# Patient Record
Sex: Female | Born: 1950 | Race: White | Hispanic: No | Marital: Married | Smoking: Current every day smoker
Health system: Southern US, Community
[De-identification: ages and names within clinical notes are randomized; demographics above are authoritative.]

## PROBLEM LIST (undated history)

## (undated) DIAGNOSIS — C801 Malignant (primary) neoplasm, unspecified: Secondary | ICD-10-CM

## (undated) DIAGNOSIS — G43909 Migraine, unspecified, not intractable, without status migrainosus: Secondary | ICD-10-CM

## (undated) DIAGNOSIS — K219 Gastro-esophageal reflux disease without esophagitis: Secondary | ICD-10-CM

## (undated) DIAGNOSIS — I639 Cerebral infarction, unspecified: Secondary | ICD-10-CM

## (undated) DIAGNOSIS — J449 Chronic obstructive pulmonary disease, unspecified: Secondary | ICD-10-CM

## (undated) DIAGNOSIS — I10 Essential (primary) hypertension: Secondary | ICD-10-CM

## (undated) HISTORY — PX: BRAIN SURGERY: SHX531

## (undated) HISTORY — PX: ABDOMINAL HYSTERECTOMY: SHX81

---

## 2003-12-30 ENCOUNTER — Other Ambulatory Visit: Payer: Self-pay

## 2008-01-25 ENCOUNTER — Other Ambulatory Visit: Payer: Self-pay

## 2008-01-25 ENCOUNTER — Emergency Department: Payer: Self-pay | Admitting: Emergency Medicine

## 2011-08-14 ENCOUNTER — Emergency Department: Payer: Self-pay | Admitting: *Deleted

## 2011-10-11 ENCOUNTER — Emergency Department: Payer: Self-pay | Admitting: Emergency Medicine

## 2012-09-24 ENCOUNTER — Ambulatory Visit: Payer: Self-pay

## 2012-09-24 LAB — RAPID STREP-A WITH REFLX: Micro Text Report: NEGATIVE

## 2012-09-26 LAB — BETA STREP CULTURE(ARMC)

## 2015-11-29 ENCOUNTER — Ambulatory Visit
Admission: EM | Admit: 2015-11-29 | Discharge: 2015-11-29 | Disposition: A | Discharge status: Home / Self Care | Payer: Medicare PPO | Attending: Family Medicine | Admitting: Family Medicine

## 2015-11-29 ENCOUNTER — Ambulatory Visit (INDEPENDENT_AMBULATORY_CARE_PROVIDER_SITE_OTHER): Payer: Medicare PPO

## 2015-11-29 ENCOUNTER — Other Ambulatory Visit: Payer: Self-pay

## 2015-11-29 ENCOUNTER — Encounter: Payer: Self-pay | Admitting: *Deleted

## 2015-11-29 DIAGNOSIS — F418 Other specified anxiety disorders: Secondary | ICD-10-CM

## 2015-11-29 DIAGNOSIS — F17200 Nicotine dependence, unspecified, uncomplicated: Secondary | ICD-10-CM | POA: Diagnosis not present

## 2015-11-29 DIAGNOSIS — R062 Wheezing: Secondary | ICD-10-CM

## 2015-11-29 DIAGNOSIS — J449 Chronic obstructive pulmonary disease, unspecified: Secondary | ICD-10-CM | POA: Diagnosis not present

## 2015-11-29 DIAGNOSIS — N390 Urinary tract infection, site not specified: Secondary | ICD-10-CM | POA: Insufficient documentation

## 2015-11-29 DIAGNOSIS — K219 Gastro-esophageal reflux disease without esophagitis: Secondary | ICD-10-CM | POA: Diagnosis not present

## 2015-11-29 DIAGNOSIS — Z79899 Other long term (current) drug therapy: Secondary | ICD-10-CM | POA: Insufficient documentation

## 2015-11-29 DIAGNOSIS — F419 Anxiety disorder, unspecified: Secondary | ICD-10-CM | POA: Insufficient documentation

## 2015-11-29 DIAGNOSIS — I10 Essential (primary) hypertension: Secondary | ICD-10-CM | POA: Insufficient documentation

## 2015-11-29 DIAGNOSIS — F329 Major depressive disorder, single episode, unspecified: Secondary | ICD-10-CM | POA: Insufficient documentation

## 2015-11-29 DIAGNOSIS — M549 Dorsalgia, unspecified: Secondary | ICD-10-CM | POA: Diagnosis present

## 2015-11-29 HISTORY — DX: Gastro-esophageal reflux disease without esophagitis: K21.9

## 2015-11-29 HISTORY — DX: Essential (primary) hypertension: I10

## 2015-11-29 HISTORY — DX: Chronic obstructive pulmonary disease, unspecified: J44.9

## 2015-11-29 HISTORY — DX: Migraine, unspecified, not intractable, without status migrainosus: G43.909

## 2015-11-29 LAB — CBC WITH DIFFERENTIAL/PLATELET
Basophils Absolute: 0 10*3/uL (ref 0–0.1)
Basophils Relative: 0 %
Eosinophils Absolute: 0.2 10*3/uL (ref 0–0.7)
Eosinophils Relative: 3 %
HCT: 43.6 % (ref 35.0–47.0)
Hemoglobin: 14.7 g/dL (ref 12.0–16.0)
Lymphocytes Relative: 37 %
Lymphs Abs: 2.8 10*3/uL (ref 1.0–3.6)
MCH: 31.8 pg (ref 26.0–34.0)
MCHC: 33.7 g/dL (ref 32.0–36.0)
MCV: 94.3 fL (ref 80.0–100.0)
Monocytes Absolute: 0.6 10*3/uL (ref 0.2–0.9)
Monocytes Relative: 8 %
Neutro Abs: 3.9 10*3/uL (ref 1.4–6.5)
Neutrophils Relative %: 52 %
Platelets: 263 10*3/uL (ref 150–440)
RBC: 4.62 MIL/uL (ref 3.80–5.20)
RDW: 14 % (ref 11.5–14.5)
WBC: 7.5 10*3/uL (ref 3.6–11.0)

## 2015-11-29 LAB — URINALYSIS COMPLETE WITH MICROSCOPIC (ARMC ONLY)
Bilirubin Urine: NEGATIVE
Glucose, UA: NEGATIVE mg/dL
Ketones, ur: NEGATIVE mg/dL
Nitrite: NEGATIVE
Protein, ur: NEGATIVE mg/dL
Specific Gravity, Urine: 1.01 (ref 1.005–1.030)
pH: 6.5 (ref 5.0–8.0)

## 2015-11-29 LAB — COMPREHENSIVE METABOLIC PANEL
ALT: 15 U/L (ref 14–54)
AST: 21 U/L (ref 15–41)
Albumin: 4.5 g/dL (ref 3.5–5.0)
Alkaline Phosphatase: 72 U/L (ref 38–126)
Anion gap: 6 (ref 5–15)
BUN: 15 mg/dL (ref 6–20)
CO2: 24 mmol/L (ref 22–32)
Calcium: 9.6 mg/dL (ref 8.9–10.3)
Chloride: 106 mmol/L (ref 101–111)
Creatinine, Ser: 0.78 mg/dL (ref 0.44–1.00)
GFR calc Af Amer: >60 mL/min (ref >60)
GFR calc non Af Amer: >60 mL/min (ref >60)
Glucose, Bld: 115 mg/dL — ABNORMAL HIGH (ref 65–99)
Potassium: 4.1 mmol/L (ref 3.5–5.1)
Sodium: 136 mmol/L (ref 135–145)
Total Bilirubin: 0.3 mg/dL (ref 0.3–1.2)
Total Protein: 7.2 g/dL (ref 6.5–8.1)

## 2015-11-29 LAB — LIPASE, BLOOD: Lipase: 30 U/L (ref 11–51)

## 2015-11-29 MED ORDER — ALPRAZOLAM 0.25 MG PO TABS: 0.2500 mg | ORAL_TABLET | Freq: Every evening | ORAL | Status: DC | PRN

## 2015-11-29 MED ORDER — NITROFURANTOIN MONOHYD MACRO 100 MG PO CAPS: 100.0000 mg | ORAL_CAPSULE | Freq: Two times a day (BID) | ORAL | Status: DC

## 2015-11-29 NOTE — Discharge Instructions (Signed)
Panic Attacks °Panic attacks are sudden, short feelings of great fear or discomfort. You may have them for no reason when you are relaxed, when you are uneasy (anxious), or when you are sleeping.  °HOME CARE °· Take all your medicines as told. °· Check with your doctor before starting new medicines. °· Keep all doctor visits. °GET HELP IF: °· You are not able to take your medicines as told. °· Your symptoms do not get better. °· Your symptoms get worse. °GET HELP RIGHT AWAY IF: °· Your attacks seem different than your normal attacks. °· You have thoughts about hurting yourself or others. °· You take panic attack medicine and you have a side effect. °MAKE SURE YOU: °· Understand these instructions. °· Will watch your condition. °· Will get help right away if you are not doing well or get worse. °  °This information is not intended to replace advice given to you by your health care provider. Make sure you discuss any questions you have with your health care provider. °  °Document Released: 10/15/2010 Document Revised: 07/03/2013 Document Reviewed: 04/26/2013 °Elsevier Interactive Patient Education ©2016 [employer] ° °

## 2015-11-29 NOTE — ED Provider Notes (Signed)
CSN: [id number]     Arrival date & time 11/29/15  1250 History   First MD Initiated Contact with Patient 11/29/15 1359     Chief Complaint  Patient presents with  . Back Pain   (Consider location/radiation/quality/duration/timing/severity/associa ted sxs/prior Treatment) HPI   65 year old female presents with back pain last night. He states that she initially had pain under her right anterior ribs and could hardly breathe because it hurt so badly. Pain seemed to go straight through to her back. She continues to have pressure her back and now her head. States she has had no neurological sx. Had nausea but no vomiting. Felt very bloated last night and even attempted to have a enema but this did not relieve the pressure as well. The patient does seem to have some difficulty concentrating today seems appropriate but anxious. She has had a cholecystectomy in the past.   I had several follow-up visits while she was waiting  For laboratory results. I told her that the findings on most of her laboratory were norma. She seemed very anxious and aloof and did not want to disclose what may be bothering her. Actually she told me that she was having difficulties with her daughter who been in and out of jail. She states that she does not disclose many things to her psychiatrist though she probably should. Continue complain of pressure in her back and head  But had no neurological symptoms. She went from having epigastric pain and right upper quadrant pain last night to the back pain and pressure and head ache. Past Medical History  Diagnosis Date  . COPD (chronic obstructive pulmonary disease) ([place])   . Hypertension   . GERD (gastroesophageal reflux disease)   . Migraines    Past Surgical History  Procedure Laterality Date  . Abdominal hysterectomy     History reviewed. No pertinent family history. Social History  Substance Use Topics  . Smoking status: Current Every Day Smoker  . Smokeless tobacco:  Never Used  . Alcohol Use: No   OB History    No data available     Review of Systems  Constitutional: Negative for fever, chills and fatigue.  HENT: Negative for congestion.   Respiratory: Positive for cough.   Gastrointestinal: Positive for nausea and abdominal pain. Negative for vomiting, diarrhea, constipation, blood in stool and abdominal distention.  Genitourinary: Negative for dysuria, frequency, flank pain, vaginal bleeding, vaginal discharge, difficulty urinating, vaginal pain and pelvic pain.  Musculoskeletal: Positive for back pain.    Allergies  Review of patient's allergies indicates no known allergies.  Home Medications   Prior to Admission medications   Medication Sig Start Date End Date Taking? Authorizing Provider  albuterol (PROVENTIL HFA) 108 (90 Base) MCG/ACT inhaler Inhale 2 puffs into the lungs every 4 (four) hours as needed for wheezing or shortness of breath.   Yes Historical Provider, MD  baclofen (LIORESAL) 10 MG tablet Take 10 mg by mouth 2 (two) times daily.   Yes Historical Provider, MD  chlorthalidone (HYGROTON) 25 MG tablet Take 25 mg by mouth daily.   Yes Historical Provider, MD  fluticasone (FLONASE) 50 MCG/ACT nasal spray Place 1 spray into both nostrils daily.   Yes Historical Provider, MD  ipratropium (ATROVENT HFA) 17 MCG/ACT inhaler Inhale 2 puffs into the lungs every 4 (four) hours as needed for wheezing.   Yes Historical Provider, MD  lamoTRIgine (LAMICTAL) 100 MG tablet Take 100 mg by mouth daily.   Yes Historical Provider, MD  lisinopril (PRINIVIL,ZESTRIL) 20 MG tablet Take 20 mg by mouth daily.   Yes Historical Provider, MD  loratadine (CLARITIN) 10 MG tablet Take 10 mg by mouth daily.   Yes Historical Provider, MD  nystatin (MYCOSTATIN) 100000 UNIT/ML suspension Take 5 mLs by mouth 4 (four) times daily as needed.   Yes Historical Provider, MD  omeprazole (PRILOSEC) 20 MG capsule Take 20 mg by mouth daily.   Yes Historical Provider, MD   pravastatin (PRAVACHOL) 10 MG tablet Take 10 mg by mouth daily.   Yes Historical Provider, MD  promethazine (PHENERGAN) 25 MG tablet Take 25 mg by mouth every 6 (six) hours as needed for nausea or vomiting.   Yes Historical Provider, MD  ranitidine (ZANTAC) 150 MG tablet Take 150 mg by mouth 2 (two) times daily.   Yes Historical Provider, MD  SUMAtriptan (IMITREX) 20 MG/ACT nasal spray Place 20 mg into the nose every 2 (two) hours as needed for migraine or headache. May repeat in 2 hours if headache persists or recurs.   Yes Historical Provider, MD  tretinoin (RETIN-A) 0.025 % gel Apply topically at bedtime.   Yes Historical Provider, MD  ALPRAZolam (XANAX) 0.25 MG tablet Take 1 tablet (0.25 mg total) by mouth at bedtime as needed for anxiety. 11/29/15   Lorin Picket, PA-C  nitrofurantoin, macrocrystal-monohydrate, (MACROBID) 100 MG capsule Take 1 capsule (100 mg total) by mouth 2 (two) times daily. 11/29/15   Lorin Picket, PA-C   Meds Ordered and Administered this Visit  Medications - No data to display  BP 158/66 mmHg  Pulse 77  Temp(Src) 98 F (36.7 C) (Oral)  Resp 18  Ht 5\' 5"  (1.651 m)  Wt 166 lb (75.297 kg)  BMI 27.62 kg/m2  SpO2 100% No data found.   Physical Exam  Constitutional: She is oriented to person, place, and time. She appears well-developed and well-nourished. No distress.  HENT:  Head: Normocephalic and atraumatic.  Eyes: Conjunctivae are normal. Pupils are equal, round, and reactive to light.  Neck: Normal range of motion. Neck supple.  Pulmonary/Chest: Effort normal. No respiratory distress. She has wheezes. She has no rales.  Abdominal: Soft.  Examination of the abdomen was carried out with Lenna Sciara, CMA as assistant and chaperone. Bowel sounds are hypotonic. There is a well-healed surgical incision of the right upper quadrant. With palpation the patient jumps with the chair change of hand position. She is most tender in the epigastrium. There is no guarding  present. There is no rebound present.  Musculoskeletal: Normal range of motion. She exhibits no edema or tenderness.  Neurological: She is alert and oriented to person, place, and time.  Skin: Skin is warm and dry. She is not diaphoretic.  Psychiatric: She has a normal mood and affect. Her behavior is normal. Judgment and thought content normal.  Nursing note and vitals reviewed.   ED Course  Procedures (including critical care time)  Labs Review Labs Reviewed  COMPREHENSIVE METABOLIC PANEL - Abnormal; Notable for the following:    Glucose, Bld 115 (*)    All other components within normal limits  URINALYSIS COMPLETEWITH MICROSCOPIC ([facility] ONLY) - Abnormal; Notable for the following:    Hgb urine dipstick TRACE (*)    Leukocytes, UA 1+ (*)    Bacteria, UA FEW (*)    Squamous Epithelial / LPF 0-5 (*)    All other components within normal limits  URINE CULTURE  CBC WITH DIFFERENTIAL/PLATELET  LIPASE, BLOOD    Imaging Review Dg Chest  2 View  11/29/2015  CLINICAL DATA:  Cough for 2 days.  History of COPD . EXAM: CHEST  2 VIEW COMPARISON:  09/25/2011 FINDINGS: Hyperinflation. Midline trachea. Normal heart size and mediastinal contours for age. Right cardiophrenic angle blunting is chronic and likely due to a prominent epicardial fat pad. Clear lungs. IMPRESSION: Hyperinflation, without acute disease. Electronically Signed   By: Abigail Miyamoto M.D.   On: 11/29/2015 15:17     Visual Acuity Review  Right Eye Distance:   Left Eye Distance:   Bilateral Distance:    Right Eye Near:   Left Eye Near:    Bilateral Near:         MDM   1. UTI (lower urinary tract infection)   2. Anxiety and depression    Discharge Medication List as of 11/29/2015  4:33 PM    START taking these medications   Details  ALPRAZolam (XANAX) 0.25 MG tablet Take 1 tablet (0.25 mg total) by mouth at bedtime as needed for anxiety., Starting 11/29/2015, Until Discontinued, Print    nitrofurantoin,  macrocrystal-monohydrate, (MACROBID) 100 MG capsule Take 1 capsule (100 mg total) by mouth 2 (two) times daily., Starting 11/29/2015, Until Discontinued, Print      Plan: 1. Test/x-ray results and diagnosis reviewed with patient 2. rx as per orders; risks, benefits, potential side effects reviewed with patient 3. Recommend supportive treatment with rest and paying attention to her symptoms. I've asked her to immediately go the emergency room if there is any change in her symptoms are that  intensifies or does not improve. He is to schedule an appointment with her psychiatrist sooner than next month. If she has not improved her that she will likely require imaging to what is exactly wrong with her. 4. F/u prn if symptoms worsen or don't improve     Lorin Picket, PA-C 11/29/15 1707

## 2015-11-29 NOTE — ED Notes (Signed)
Patient started having back pain last PM. Patient is not sure what may have caused her back pain.

## 2015-12-01 LAB — URINE CULTURE

## 2018-01-05 ENCOUNTER — Emergency Department
Admission: EM | Admit: 2018-01-05 | Discharge: 2018-01-06 | Disposition: A | Discharge status: Home / Self Care | Payer: Medicare HMO | Attending: Emergency Medicine | Admitting: Emergency Medicine

## 2018-01-05 ENCOUNTER — Emergency Department: Payer: Medicare HMO

## 2018-01-05 DIAGNOSIS — F17200 Nicotine dependence, unspecified, uncomplicated: Secondary | ICD-10-CM | POA: Insufficient documentation

## 2018-01-05 DIAGNOSIS — R51 Headache: Secondary | ICD-10-CM | POA: Diagnosis not present

## 2018-01-05 DIAGNOSIS — Z79899 Other long term (current) drug therapy: Secondary | ICD-10-CM | POA: Diagnosis not present

## 2018-01-05 DIAGNOSIS — I10 Essential (primary) hypertension: Secondary | ICD-10-CM | POA: Diagnosis not present

## 2018-01-05 DIAGNOSIS — J449 Chronic obstructive pulmonary disease, unspecified: Secondary | ICD-10-CM | POA: Insufficient documentation

## 2018-01-05 DIAGNOSIS — R21 Rash and other nonspecific skin eruption: Secondary | ICD-10-CM | POA: Diagnosis not present

## 2018-01-05 DIAGNOSIS — R519 Headache, unspecified: Secondary | ICD-10-CM

## 2018-01-05 DIAGNOSIS — R9389 Abnormal findings on diagnostic imaging of other specified body structures: Secondary | ICD-10-CM | POA: Diagnosis not present

## 2018-01-05 HISTORY — DX: Malignant (primary) neoplasm, unspecified: C80.1

## 2018-01-05 LAB — CBC WITH DIFFERENTIAL/PLATELET
BASOS ABS: 0.1 10*3/uL (ref 0–0.1)
BASOS PCT: 1 %
EOS ABS: 0.2 10*3/uL (ref 0–0.7)
EOS PCT: 3 %
HCT: 39.5 % (ref 35.0–47.0)
Hemoglobin: 13.6 g/dL (ref 12.0–16.0)
LYMPHS PCT: 47 %
Lymphs Abs: 2.9 10*3/uL (ref 1.0–3.6)
MCH: 32.7 pg (ref 26.0–34.0)
MCHC: 34.4 g/dL (ref 32.0–36.0)
MCV: 94.8 fL (ref 80.0–100.0)
MONO ABS: 0.4 10*3/uL (ref 0.2–0.9)
Monocytes Relative: 6 %
Neutro Abs: 2.7 10*3/uL (ref 1.4–6.5)
Neutrophils Relative %: 43 %
PLATELETS: 233 10*3/uL (ref 150–440)
RBC: 4.16 MIL/uL (ref 3.80–5.20)
RDW: 13.4 % (ref 11.5–14.5)
WBC: 6.2 10*3/uL (ref 3.6–11.0)

## 2018-01-05 LAB — URINE DRUG SCREEN, QUALITATIVE (ARMC ONLY)
AMPHETAMINES, UR SCREEN: NOT DETECTED
BENZODIAZEPINE, UR SCRN: NOT DETECTED
Barbiturates, Ur Screen: NOT DETECTED
Cannabinoid 50 Ng, Ur ~~LOC~~: NOT DETECTED
Cocaine Metabolite,Ur ~~LOC~~: NOT DETECTED
MDMA (Ecstasy)Ur Screen: NOT DETECTED
Methadone Scn, Ur: NOT DETECTED
Opiate, Ur Screen: NOT DETECTED
PHENCYCLIDINE (PCP) UR S: NOT DETECTED
TRICYCLIC, UR SCREEN: NOT DETECTED

## 2018-01-05 LAB — URINALYSIS, COMPLETE (UACMP) WITH MICROSCOPIC
BILIRUBIN URINE: NEGATIVE
Bacteria, UA: NONE SEEN
Glucose, UA: NEGATIVE mg/dL
Hgb urine dipstick: NEGATIVE
Ketones, ur: NEGATIVE mg/dL
Nitrite: NEGATIVE
PH: 6 (ref 5.0–8.0)
Protein, ur: NEGATIVE mg/dL
SPECIFIC GRAVITY, URINE: 1.008 (ref 1.005–1.030)

## 2018-01-05 LAB — ETHANOL: Alcohol, Ethyl (B): <10 mg/dL (ref <10)

## 2018-01-05 MED ORDER — AZITHROMYCIN 500 MG PO TABS: 500.0000 mg | ORAL_TABLET | Freq: Every day | ORAL | Status: DC

## 2018-01-05 MED ORDER — AZITHROMYCIN 250 MG PO TABS: ORAL_TABLET | ORAL | 0 refills | Status: DC

## 2018-01-05 MED ORDER — ACETAMINOPHEN 325 MG PO TABS
ORAL_TABLET | ORAL | Status: AC
  Filled 2018-01-05: qty 2

## 2018-01-05 MED ORDER — ACETAMINOPHEN 325 MG PO TABS
650.0000 mg | ORAL_TABLET | Freq: Once | ORAL | Status: AC
  Administered 2018-01-05: 650 mg via ORAL

## 2018-01-05 MED ORDER — CEPHALEXIN 500 MG PO CAPS: 500.0000 mg | ORAL_CAPSULE | Freq: Three times a day (TID) | ORAL | 0 refills | Status: AC

## 2018-01-05 MED ORDER — SODIUM CHLORIDE 0.9 % IV BOLUS
500.0000 mL | Freq: Once | INTRAVENOUS | Status: AC
  Administered 2018-01-05: 500 mL via INTRAVENOUS

## 2018-01-05 MED ORDER — AZITHROMYCIN 500 MG PO TABS: 250.0000 mg | ORAL_TABLET | Freq: Every day | ORAL | Status: DC

## 2018-01-05 NOTE — Discharge Instructions (Addendum)
There is a couple things that you came in for [place] to evaluate for.  You are aware that he might of had a stroke.  CT scan and exam does not show any evidence of this.  We do encourage you to continue to drink plenty of fluids and to follow closely with her primary care doctor for your chronic headaches.  Chest x-ray shows a slightly unusual finding which could be in the right circumstance of pneumonia but likely is related to the breath that you took.  Will require a repeat chest x-ray in a few days to make sure this is not getting worse and if you have fever or cough please return to the emergency room.  In addition, you are concerned about her rashes been there since the beginning part of this year.  We are not exactly sure what that is caused by at this time.  We strongly advise that you follow closely with you and see dermatology for further assessment we will start you  on antibiotics and will cover you for possible pneumonia some extent and as well as hopefully for this hand lesion that you have been suffering from.  If any of this worsens in any significant way, return to medical attention.  We are also sending viral and bacterial cultures and if we need to change treatment we will let you know.

## 2018-01-05 NOTE — ED Provider Notes (Addendum)
[facility] Emergency Department Provider Note  ____________________________________________   I have reviewed the triage vital signs and the nursing notes. Where available I have reviewed prior notes and, if possible and indicated, outside hospital notes.    HISTORY  Chief Complaint Altered Mental Status; Headache; and Rash    HPI Stephanie Jones is a 67 y.o. female presented to the emergency room with a large number of complaints none of which seem to be acute.  The first is that she is gets occasional headaches has for years but they seem more since last October.  This is mid April.  She has not had a fall fever or stiff neck.  She states that she sometimes feels lightheaded.  Patient is on multiple medications and does drink alcohol at night.  She has not had any focal numbness or weakness, she is not had any sudden onset headaches, no stiff neck no fevers.  The patient has been followed by primary care for this, has had MRIs in the past for her headaches going back over a decade.  They do not seem to be different.  Family is however very convinced that she may have had a stroke.  Patient has a history of bipolar but they states she is acting "off" no and can exactly tell me what this means but they state that she has been "not quite right since last October.  They just saw their primary care doctor 2 days ago and they have been telling the primary care doctor this for some time apparently according to them but they are not satisfied with the evaluation that the patient has received.  The patient does have a history of breast cancer, she had a vasectomy she refused radiation and is thought to be in remission at this time.  The patient has polypharmacy issues and they are trying to wean her off her clonazepam. Sometimes she feels "lightheaded or dizzy" but she has no chest pain or shortness of breath and this is not happening at this moment.  Family feels is been no acute  change since October they just want to "get to the bottom of it"  Family are also concerned the patient has not had a rash for 5 months.  It comes and goes.  Used to be on the face which is now clearing up mostly but there are still some lesions on her hand which she seems to pick at.  She is seen her primary care doctor for this rash and been told that it was a possible med reaction.  She states she has had lesions on both palms now for  Past Medical History:  Diagnosis Date  . Cancer ([place])    breast  . COPD (chronic obstructive pulmonary disease) ([place])   . GERD (gastroesophageal reflux disease)   . Hypertension   . Migraines     There are no active problems to display for this patient.   Past Surgical History:  Procedure Laterality Date  . ABDOMINAL HYSTERECTOMY      Prior to Admission medications   Medication Sig Start Date End Date Taking? Authorizing Provider  albuterol (PROVENTIL HFA) 108 (90 Base) MCG/ACT inhaler Inhale 2 puffs into the lungs every 4 (four) hours as needed for wheezing or shortness of breath.    [provider]  ALPRAZolam Duanne Moron) 0.25 MG tablet Take 1 tablet (0.25 mg total) by mouth at bedtime as needed for anxiety. 11/29/15   Lorin Picket, PA-C  baclofen (LIORESAL)  10 MG tablet Take 10 mg by mouth 2 (two) times daily.    [provider]  chlorthalidone (HYGROTON) 25 MG tablet Take 25 mg by mouth daily.    [provider]  fluticasone (FLONASE) 50 MCG/ACT nasal spray Place 1 spray into both nostrils daily.    [provider]  ipratropium (ATROVENT HFA) 17 MCG/ACT inhaler Inhale 2 puffs into the lungs every 4 (four) hours as needed for wheezing.    [provider]  lamoTRIgine (LAMICTAL) 100 MG tablet Take 100 mg by mouth daily.    [provider]  lisinopril (PRINIVIL,ZESTRIL) 20 MG tablet Take 20 mg by mouth daily.    [provider]  loratadine (CLARITIN) 10 MG tablet Take 10 mg by mouth  daily.    [provider]  nitrofurantoin, macrocrystal-monohydrate, (MACROBID) 100 MG capsule Take 1 capsule (100 mg total) by mouth 2 (two) times daily. 11/29/15   Lorin Picket, PA-C  nystatin (MYCOSTATIN) 100000 UNIT/ML suspension Take 5 mLs by mouth 4 (four) times daily as needed.    [provider]  omeprazole (PRILOSEC) 20 MG capsule Take 20 mg by mouth daily.    [provider]  pravastatin (PRAVACHOL) 10 MG tablet Take 10 mg by mouth daily.    [provider]  promethazine (PHENERGAN) 25 MG tablet Take 25 mg by mouth every 6 (six) hours as needed for nausea or vomiting.    [provider]  ranitidine (ZANTAC) 150 MG tablet Take 150 mg by mouth 2 (two) times daily.    [provider]  SUMAtriptan (IMITREX) 20 MG/ACT nasal spray Place 20 mg into the nose every 2 (two) hours as needed for migraine or headache. May repeat in 2 hours if headache persists or recurs.    [provider]  tretinoin (RETIN-A) 0.025 % gel Apply topically at bedtime.    [provider]    Allergies Patient has no known allergies.  No family history on file.  Social History Social History   Tobacco Use  . Smoking status: Current Every Day Smoker  . Smokeless tobacco: Never Used  Substance Use Topics  . Alcohol use: No  . Drug use: No    Review of Systems Constitutional: No fever/chills Eyes: No visual changes. ENT: No sore throat. No stiff neck no neck pain Cardiovascular: Denies chest pain. Respiratory: Denies shortness of breath. Gastrointestinal:   no vomiting.  No diarrhea.  No constipation. Genitourinary: Negative for dysuria. Musculoskeletal: Negative lower extremity swelling Skin: See HPI Neurological: Negative for severe headaches, focal weakness or numbness.   ____________________________________________   PHYSICAL EXAM:  VITAL SIGNS: ED Triage Vitals  Enc Vitals Group     BP 01/05/18 2157 97/73     Pulse  Rate 01/05/18 2157 61     Resp 01/05/18 2157 18     Temp 01/05/18 2157 98.3 F (36.8 C)     Temp Source 01/05/18 2157 Oral     SpO2 01/05/18 2157 95 %     Weight 01/05/18 2158 166 lb (75.3 kg)     Height --      Head Circumference --      Peak Flow --      Pain Score 01/05/18 2157 8     Pain Loc --      Pain Edu? --      Excl. in [place]? --     Constitutional: Alert and oriented. Well appearing and in no acute distress. Eyes: Conjunctivae are normal  Head: Atraumatic HEENT: No congestion/rhinnorhea. Mucous membranes are moist.  Oropharynx non-erythematous Neck:   Nontender with no meningismus, no masses, no stridor Cardiovascular: Normal rate, regular rhythm. Grossly normal heart sounds.  Good peripheral circulation. Respiratory: Normal respiratory effort.  No retractions. Lungs CTAB. Abdominal: Soft and nontender. No distention. No guarding no rebound Back:  There is no focal tenderness or step off.  there is no midline tenderness there are no lesions noted. there is no CVA tenderness Musculoskeletal: No lower extremity tenderness, no upper extremity tenderness. No joint effusions, no DVT signs strong distal pulses no edema Neurologic:  Normal speech and language. No gross focal neurologic deficits are appreciated.  Skin:  Skin is warm, dry and intact.  There is what appears to be either acne or bug bites on the patient's face, small raised pustules which are nearly but not quite linear, nontender, erythematous, no purulence.  Is a small cluster pustules, not vesicles, with purulence, mildly indurated but no underlying induration or cellulitic changes, 4 of them in a small area on the hyperthenar eminence they are nontender.  They are lightly raised.   Psychiatric: Mood and affect are anxious. Speech and behavior are normal.  ____________________________________________   LABS (all labs ordered are listed, but only abnormal results are displayed)  Labs Reviewed  CBC WITH  DIFFERENTIAL/PLATELET  COMPREHENSIVE METABOLIC PANEL  URINE DRUG SCREEN, QUALITATIVE ([facility] ONLY)  URINALYSIS, COMPLETE ([facility]) WITH MICROSCOPIC  ETHANOL  HERPES SIMPLEX VIRUS(HSV) DNA BY PCR    Pertinent labs  results that were available during my care of the patient were reviewed by me and considered in my medical decision making (see chart for details). ____________________________________________  EKG  I personally interpreted any EKGs ordered by me or triage  ____________________________________________  RADIOLOGY  Pertinent labs & imaging results that were available during my care of the patient were reviewed by me and considered in my medical decision making (see chart for details). If possible, patient and/or family made aware of any abnormal findings.  No results found. ____________________________________________    PROCEDURES  Procedure(s) performed: None  Procedures  Critical Care performed: None  ____________________________________________   INITIAL IMPRESSION / ASSESSMENT AND PLAN / ED COURSE  Pertinent labs & imaging results that were available during my care of the patient were reviewed by me and considered in my medical decision making (see chart for details).  Patient here with multiple complaints all of them a very long duration.  She has had a rash off and on on her hands and face and other places mostly hands and face therefore 3 or 4 months.  PCP has been following her for this.  This is a chronic issue, I will send HSV PCR though I do have low suspicion that this is herpetic.  Appear to be slightly infected possibly insect bites.  We will send HSV PCR as well as wound culture to see what is going on and I have told her she must follow-up with [facility] dermatology in the next few days.  We will treat her empirically with Keflex pending assessment.  On exam not consistent with [facility] spotted fever or syphilis  The other issues for this patient involve a  sense with the family that she has not been quite right for 7 months.  They really want to do a CT scan to make sure that she had not had a stroke in the last year or so.  She has an NIH stroke scale 0.  She is texting  on her phone in no acute distress.  I do not think any acute pathology has been identified on physical exam thus far and I think that if her workup is reassuring as I anticipate she will be able to go home.  Her chest x-ray, she is not complaining of cough she has clear lungs, I do not think this likely represents a pneumonia.  Keflex should give some coverage however I strongly advised that she follow closely with primary care for recheck of this x-ray.  ----------------------------------------- 11:43 PM on 01/05/2018 -----------------------------------------  I did recheck with patient she states "yes I have been coughing" she initially did deny it.  Patient is not a particularly reliable historian.  CMP is pending if that is reassuring vital signs look okay we will get her safely home with close outpatient follow-up.  None of these issues are acute as far as I can tell.  We will add Zithromax to cover atypical coverage for the chest x-ray. Signed out to dr.Robinson at the end of my shift.      ____________________________________________   FINAL CLINICAL IMPRESSION(S) / ED DIAGNOSES  Final diagnoses:  None      This chart was dictated using voice recognition software.  Despite best efforts to proofread,  errors can occur which can change meaning.      Schuyler Amor, MD 01/05/18 2330    Schuyler Amor, MD 01/05/18 3818    Schuyler Amor, MD 01/05/18 2344    Schuyler Amor, MD 01/05/18 [phone]

## 2018-01-05 NOTE — ED Triage Notes (Signed)
Pt reports that she has been forgetful, having headaches, and a rash for a month or more.  Pt saw PCP on Monday, but PCP didn't say anything.  Per daughter pt's had reconstructive surgery in January for breast cancer and that she has been "off" ever since.  Pt is difficult to understand in triage.

## 2018-01-05 NOTE — ED Notes (Signed)
ED Provider at bedside att collecting culture specimen from left hand palm lesions

## 2018-01-05 NOTE — ED Notes (Signed)
Pt taken to CT then xray

## 2018-01-06 LAB — COMPREHENSIVE METABOLIC PANEL
ALBUMIN: 4 g/dL (ref 3.5–5.0)
ALT: 28 U/L (ref 14–54)
AST: 32 U/L (ref 15–41)
Alkaline Phosphatase: 52 U/L (ref 38–126)
Anion gap: 9 (ref 5–15)
BILIRUBIN TOTAL: 0.3 mg/dL (ref 0.3–1.2)
BUN: 17 mg/dL (ref 6–20)
CALCIUM: 9.6 mg/dL (ref 8.9–10.3)
CO2: 25 mmol/L (ref 22–32)
CREATININE: 0.96 mg/dL (ref 0.44–1.00)
Chloride: 105 mmol/L (ref 101–111)
GFR calc Af Amer: >60 mL/min (ref >60)
GFR calc non Af Amer: >60 mL/min (ref >60)
GLUCOSE: 120 mg/dL — AB (ref 65–99)
Potassium: 3.5 mmol/L (ref 3.5–5.1)
Sodium: 139 mmol/L (ref 135–145)
TOTAL PROTEIN: 6.5 g/dL (ref 6.5–8.1)

## 2018-01-08 LAB — HERPES SIMPLEX VIRUS(HSV) DNA BY PCR
HSV 1 DNA: NEGATIVE
HSV 2 DNA: NEGATIVE

## 2018-01-09 LAB — AEROBIC CULTURE W GRAM STAIN (SUPERFICIAL SPECIMEN)

## 2018-01-09 LAB — AEROBIC CULTURE  (SUPERFICIAL SPECIMEN): CULTURE: NO GROWTH

## 2018-09-16 ENCOUNTER — Emergency Department: Payer: Medicare HMO

## 2018-09-16 ENCOUNTER — Observation Stay
Admission: EM | Admit: 2018-09-16 | Discharge: 2018-09-18 | Disposition: A | Discharge status: Home / Self Care | Payer: Medicare HMO | Attending: Internal Medicine | Admitting: Internal Medicine

## 2018-09-16 ENCOUNTER — Encounter: Payer: Self-pay | Admitting: Radiology

## 2018-09-16 ENCOUNTER — Other Ambulatory Visit: Payer: Self-pay

## 2018-09-16 DIAGNOSIS — Z853 Personal history of malignant neoplasm of breast: Secondary | ICD-10-CM | POA: Diagnosis not present

## 2018-09-16 DIAGNOSIS — J449 Chronic obstructive pulmonary disease, unspecified: Secondary | ICD-10-CM | POA: Diagnosis not present

## 2018-09-16 DIAGNOSIS — I119 Hypertensive heart disease without heart failure: Secondary | ICD-10-CM | POA: Insufficient documentation

## 2018-09-16 DIAGNOSIS — Z23 Encounter for immunization: Secondary | ICD-10-CM | POA: Insufficient documentation

## 2018-09-16 DIAGNOSIS — Z79899 Other long term (current) drug therapy: Secondary | ICD-10-CM | POA: Insufficient documentation

## 2018-09-16 DIAGNOSIS — G43909 Migraine, unspecified, not intractable, without status migrainosus: Secondary | ICD-10-CM | POA: Insufficient documentation

## 2018-09-16 DIAGNOSIS — G9340 Encephalopathy, unspecified: Principal | ICD-10-CM | POA: Insufficient documentation

## 2018-09-16 DIAGNOSIS — R4182 Altered mental status, unspecified: Secondary | ICD-10-CM | POA: Diagnosis present

## 2018-09-16 DIAGNOSIS — K219 Gastro-esophageal reflux disease without esophagitis: Secondary | ICD-10-CM | POA: Diagnosis not present

## 2018-09-16 DIAGNOSIS — F17200 Nicotine dependence, unspecified, uncomplicated: Secondary | ICD-10-CM | POA: Insufficient documentation

## 2018-09-16 LAB — COMPREHENSIVE METABOLIC PANEL
ALBUMIN: 4.4 g/dL (ref 3.5–5.0)
ALK PHOS: 53 U/L (ref 38–126)
ALT: 17 U/L (ref 0–44)
AST: 18 U/L (ref 15–41)
Anion gap: 9 (ref 5–15)
BUN: 19 mg/dL (ref 8–23)
CALCIUM: 9.6 mg/dL (ref 8.9–10.3)
CO2: 28 mmol/L (ref 22–32)
CREATININE: 1.04 mg/dL — AB (ref 0.44–1.00)
Chloride: 104 mmol/L (ref 98–111)
GFR calc non Af Amer: 56 mL/min — ABNORMAL LOW (ref >60)
GLUCOSE: 99 mg/dL (ref 70–99)
Potassium: 3.6 mmol/L (ref 3.5–5.1)
SODIUM: 141 mmol/L (ref 135–145)
Total Bilirubin: 0.4 mg/dL (ref 0.3–1.2)
Total Protein: 7.1 g/dL (ref 6.5–8.1)

## 2018-09-16 LAB — DIFFERENTIAL
ABS IMMATURE GRANULOCYTES: 0.04 10*3/uL (ref 0.00–0.07)
BASOS ABS: 0.1 10*3/uL (ref 0.0–0.1)
Basophils Relative: 1 %
Eosinophils Absolute: 0.2 10*3/uL (ref 0.0–0.5)
Eosinophils Relative: 3 %
IMMATURE GRANULOCYTES: 1 %
LYMPHS PCT: 35 %
Lymphs Abs: 2.6 10*3/uL (ref 0.7–4.0)
MONO ABS: 0.5 10*3/uL (ref 0.1–1.0)
Monocytes Relative: 6 %
NEUTROS ABS: 4 10*3/uL (ref 1.7–7.7)
NEUTROS PCT: 54 %

## 2018-09-16 LAB — CBC
HEMATOCRIT: 42.6 % (ref 36.0–46.0)
HEMOGLOBIN: 14.4 g/dL (ref 12.0–15.0)
MCH: 33 pg (ref 26.0–34.0)
MCHC: 33.8 g/dL (ref 30.0–36.0)
MCV: 97.7 fL (ref 80.0–100.0)
NRBC: 0 % (ref 0.0–0.2)
Platelets: 292 10*3/uL (ref 150–400)
RBC: 4.36 MIL/uL (ref 3.87–5.11)
RDW: 12.3 % (ref 11.5–15.5)
WBC: 7.5 10*3/uL (ref 4.0–10.5)

## 2018-09-16 LAB — APTT: APTT: 28 s (ref 24–36)

## 2018-09-16 LAB — TROPONIN I: Troponin I: <0.03 ng/mL (ref <0.03)

## 2018-09-16 LAB — PROTIME-INR
INR: 0.97
Prothrombin Time: 12.8 seconds (ref 11.4–15.2)

## 2018-09-16 MED ORDER — IOPAMIDOL (ISOVUE-370) INJECTION 76%: 115.0000 mL | Freq: Once | INTRAVENOUS | Status: DC | PRN

## 2018-09-16 MED ORDER — KETAMINE HCL 10 MG/ML IJ SOLN
200.0000 mg | Freq: Once | INTRAMUSCULAR | Status: AC
  Administered 2018-09-16: 200 mg via INTRAVENOUS
  Filled 2018-09-16: qty 1

## 2018-09-16 MED ORDER — LORAZEPAM 2 MG/ML IJ SOLN
2.0000 mg | Freq: Once | INTRAMUSCULAR | Status: AC
  Administered 2018-09-16: 2 mg via INTRAVENOUS
  Filled 2018-09-16: qty 1

## 2018-09-16 NOTE — ED Notes (Signed)
Patient back from CT - unable to perform due to tremor. MD ordered IV ativan. Ativan administered. MD notified.

## 2018-09-16 NOTE — ED Provider Notes (Signed)
[facility] Emergency Department Provider Note  ____________________________________________   First MD Initiated Contact with Patient 09/16/18 2251     (approximate)  I have reviewed the triage vital signs and the nursing notes.   HISTORY  Chief Complaint Altered Mental Status and Tremors  Level 5 exemption history limited by the patient's altered mental status  HPI Stephanie Jones is a 67 y.o. female who is brought to the emergency department by her daughter for a possible stroke.  The patient's last known well time is roughly 30 hours ago.  According to the patient's daughter the patient has a longstanding resting tremor however at some point earlier today  the patient awoke and her husband noted that her eyes were "pointed the wrong direction" and she was more ataxic than usual.  She has never had a stroke before but she has been so unsteady on her feet and profoundly confused the daughter thought that she might be having 1 and brought her to the emergency department.   Past Medical History:  Diagnosis Date  . Cancer ([place])    breast  . COPD (chronic obstructive pulmonary disease) ([place])   . GERD (gastroesophageal reflux disease)   . Hypertension   . Migraines     Patient Active Problem List   Diagnosis Date Noted  . Altered mental status 09/17/2018    Past Surgical History:  Procedure Laterality Date  . ABDOMINAL HYSTERECTOMY      Prior to Admission medications   Medication Sig Start Date End Date Taking? Authorizing Provider  albuterol (PROVENTIL HFA) 108 (90 Base) MCG/ACT inhaler Inhale 2 puffs into the lungs every 4 (four) hours as needed for wheezing or shortness of breath.    [provider]  ALPRAZolam Duanne Moron) 0.25 MG tablet Take 1 tablet (0.25 mg total) by mouth at bedtime as needed for anxiety. 11/29/15   Lorin Picket, PA-C  azithromycin (ZITHROMAX Z-PAK) 250 MG tablet Take 2 tablets (500 mg) on  Day 1,  followed by 1  tablet (250 mg) once daily on Days 2 through 5. 01/05/18   Schuyler Amor, MD  baclofen (LIORESAL) 10 MG tablet Take 10 mg by mouth 2 (two) times daily.    [provider]  chlorthalidone (HYGROTON) 25 MG tablet Take 25 mg by mouth daily.    [provider]  fluticasone (FLONASE) 50 MCG/ACT nasal spray Place 1 spray into both nostrils daily.    [provider]  ipratropium (ATROVENT HFA) 17 MCG/ACT inhaler Inhale 2 puffs into the lungs every 4 (four) hours as needed for wheezing.    [provider]  lamoTRIgine (LAMICTAL) 100 MG tablet Take 100 mg by mouth daily.    [provider]  lisinopril (PRINIVIL,ZESTRIL) 20 MG tablet Take 20 mg by mouth daily.    [provider]  loratadine (CLARITIN) 10 MG tablet Take 10 mg by mouth daily.    [provider]  nitrofurantoin, macrocrystal-monohydrate, (MACROBID) 100 MG capsule Take 1 capsule (100 mg total) by mouth 2 (two) times daily. 11/29/15   Lorin Picket, PA-C  nystatin (MYCOSTATIN) 100000 UNIT/ML suspension Take 5 mLs by mouth 4 (four) times daily as needed.    [provider]  omeprazole (PRILOSEC) 20 MG capsule Take 20 mg by mouth daily.    [provider]  pravastatin (PRAVACHOL) 10 MG tablet Take 10 mg by mouth daily.    [provider]  promethazine (PHENERGAN) 25 MG tablet Take 25 mg by  mouth every 6 (six) hours as needed for nausea or vomiting.    [provider]  ranitidine (ZANTAC) 150 MG tablet Take 150 mg by mouth 2 (two) times daily.    [provider]  SUMAtriptan (IMITREX) 20 MG/ACT nasal spray Place 20 mg into the nose every 2 (two) hours as needed for migraine or headache. May repeat in 2 hours if headache persists or recurs.    [provider]  tretinoin (RETIN-A) 0.025 % gel Apply topically at bedtime.    [provider]    Allergies Patient has no known allergies.  History reviewed. No pertinent family  history.  Social History Social History   Tobacco Use  . Smoking status: Current Every Day Smoker  . Smokeless tobacco: Never Used  Substance Use Topics  . Alcohol use: No  . Drug use: No    Review of Systems Level 5 exemption history limited by the patient's clinical condition  ____________________________________________   PHYSICAL EXAM:  VITAL SIGNS: ED Triage Vitals [09/16/18 2240]  Enc Vitals Group     BP 121/67     Pulse Rate 98     Resp 16     Temp 98.1 F (36.7 C)     Temp Source Oral     SpO2 100 %     Weight 160 lb (72.6 kg)     Height 5\' 5"  (1.651 m)     Head Circumference      Peak Flow      Pain Score      Pain Loc      Pain Edu?      Excl. in [place]?     Constitutional: Alert and oriented x1 to name only.  Does not know the year, the president, where she is, or why she is here Eyes: PERRL EOMI. pupils are dilated and brisk.  No nystagmus Head: Atraumatic. Nose: No congestion/rhinnorhea. Mouth/Throat: No trismus Neck: No stridor.   Cardiovascular: Tachycardic rate, regular rhythm. Grossly normal heart sounds.  Good peripheral circulation. Respiratory: Slightly increased respiratory effort.  No retractions. Lungs CTAB and moving good air Gastrointestinal: Soft nontender Musculoskeletal: No lower extremity edema   Neurologic: Left facial droop not involving her eyebrows.  She has abnormal finger-nose-finger bilaterally although seems to be somewhat worse with the left hand.  No pronator drift.  5-5 grips biceps triceps hip flexion hip extension plantar flexion dorsiflexion.  3+ DTRs bilaterally with 3 beats of ankle clonus bilaterally.  Sensation intact light touch throughout Skin:  Skin is warm, dry and intact. No rash noted. Psychiatric: Encephalopathic   ____________________________________________   DIFFERENTIAL includes but not limited to  Stroke, TIA, intracerebral hemorrhage, dissection, meningitis, encephalitis, drug abuse, metabolic  derangement ____________________________________________   LABS (all labs ordered are listed, but only abnormal results are displayed)  Labs Reviewed  COMPREHENSIVE METABOLIC PANEL - Abnormal; Notable for the following components:      Result Value   Creatinine, Ser 1.04 (*)    GFR calc non Af Amer 56 (*)    All other components within normal limits  CSF CELL COUNT WITH DIFFERENTIAL - Abnormal; Notable for the following components:   Appearance, CSF CLEAR (*)    All other components within normal limits  CSF CELL COUNT WITH DIFFERENTIAL - Abnormal; Notable for the following components:   Appearance, CSF CLEAR (*)    All other components within normal limits  URINE DRUG SCREEN, QUALITATIVE ([facility] ONLY) - Abnormal; Notable for the following components:   Amphetamines,  Ur Screen POSITIVE (*)    Benzodiazepine, Ur Scrn POSITIVE (*)    All other components within normal limits  LIPID PANEL - Abnormal; Notable for the following components:   Triglycerides 248 (*)    HDL 34 (*)    VLDL 50 (*)    All other components within normal limits  CSF CULTURE  PROTIME-INR  APTT  CBC  DIFFERENTIAL  TROPONIN I  PROTEIN AND GLUCOSE, CSF  TSH  FOLATE  AMMONIA  PROCALCITONIN  HIV ANTIBODY (ROUTINE TESTING W REFLEX)  VDRL, CSF  HERPES SIMPLEX VIRUS(HSV) DNA BY PCR  N-METHYL-D-ASPARTATE RECPT.IGG  VITAMIN B12  RPR  HEMOGLOBIN A1C    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  CSF with no evidence of meningitis __________________________________________  EKG    ____________________________________________  RADIOLOGY  Head CT reviewed by me with no acute disease ____________________________________________   PROCEDURES  Procedure(s) performed: Yes  .Critical Care Performed by: Darel Hong, MD Authorized by: Darel Hong, MD   Critical care provider statement:    Critical care time (minutes):  40   Critical care time was exclusive of:   Separately billable procedures and treating other patients   Critical care was necessary to treat or prevent imminent or life-threatening deterioration of the following conditions:  CNS failure or compromise   Critical care was time spent personally by me on the following activities:  Development of treatment plan with patient or surrogate, discussions with consultants, evaluation of patient's response to treatment, examination of patient, obtaining history from patient or surrogate, ordering and performing treatments and interventions, ordering and review of laboratory studies, ordering and review of radiographic studies, pulse oximetry, re-evaluation of patient's condition and review of old charts .Lumbar Puncture Date/Time: 09/17/2018 1:28 AM Performed by: Darel Hong, MD Authorized by: Darel Hong, MD   Consent:    Consent obtained:  Verbal   Consent given by:  Patient   Risks discussed:  Bleeding, nerve damage, repeat procedure, pain and infection   Alternatives discussed:  Alternative treatment Sedation:    Sedation type:  Anxiolysis Anesthesia (see MAR for exact dosages):    Anesthesia method:  Local infiltration   Local anesthetic:  Lidocaine 1% w/o epi Procedure details:    Lumbar space:  L3-L4 interspace   Patient position:  R lateral decubitus   Needle gauge:  20   Needle type:  Diamond point   Needle length (in):  3.5   Ultrasound guidance: no     Number of attempts:  2   Opening pressure (cm H2O):  13   Fluid appearance:  Clear   Tubes of fluid:  4   Total volume (ml):  6 Post-procedure:    Puncture site:  Adhesive bandage applied   Patient tolerance of procedure:  Tolerated well, no immediate complications .Sedation Date/Time: 09/17/2018 8:33 AM Performed by: Darel Hong, MD Authorized by: Darel Hong, MD   Consent:    Consent obtained:  Verbal (electronic informed consent)   Consent given by:  Patient (daughter at bedside)   Risks discussed:   Allergic reaction, dysrhythmia, inadequate sedation, nausea, vomiting, respiratory compromise necessitating ventilatory assistance and intubation, prolonged sedation necessitating reversal and prolonged hypoxia resulting in organ damage Universal protocol:    Procedure explained and questions answered to patient or proxy's satisfaction: yes     Relevant documents present and verified: yes     Test results available and properly labeled: yes     Imaging studies available: yes  Required blood products, implants, devices, and special equipment available: yes     Immediately prior to procedure a time out was called: yes     Patient identity confirmation method:  Arm band Indications:    Sedation is required to allow for: emergent CT scan for stroke.   Procedure necessitating sedation performed by: CT tech. Pre-sedation assessment:    Time since last food or drink:  8 hours   ASA classification: class 2 - patient with mild systemic disease     Neck mobility: normal     Mouth opening:  2 finger widths   Thyromental distance:  3 finger widths   Mallampati score:  II - soft palate, uvula, fauces visible   Pre-sedation assessments completed and reviewed: airway patency, cardiovascular function, hydration status, mental status, nausea/vomiting, pain level, respiratory function and temperature     Pre-sedation assessment completed:  09/16/2018 11:15 PM Immediate pre-procedure details:    Reassessment: Patient reassessed immediately prior to procedure     Reviewed: vital signs, relevant labs/tests and NPO status     Verified: bag valve mask available, emergency equipment available, intubation equipment available, IV patency confirmed, oxygen available, reversal medications available and suction available   Procedure details (see MAR for exact dosages):    Sedation:  Ketamine   Intra-procedure monitoring:  Blood pressure monitoring, continuous pulse oximetry, cardiac monitor, frequent vital sign  checks and frequent LOC assessments   Intra-procedure events: none     Total Provider sedation time (minutes):  20 Post-procedure details:    Post-sedation assessment completed:  09/17/2018 2:00 AM   Attendance: Constant attendance by certified staff until patient recovered     Recovery: Patient returned to pre-procedure baseline     Post-sedation assessments completed and reviewed: airway patency, cardiovascular function, hydration status, mental status and respiratory function     Patient is stable for discharge or admission: yes     Patient tolerance:  Tolerated well, no immediate complications    Critical Care performed: Yes  ____________________________________________   INITIAL IMPRESSION / ASSESSMENT AND PLAN / ED COURSE  Pertinent labs & imaging results that were available during my care of the patient were reviewed by me and considered in my medical decision making (see chart for details).   As part of my medical decision making, I reviewed the following data within the [facility] History obtained from family if available, nursing notes, old chart and ekg, as well as notes from prior ED visits.  The patient comes to the emergency department with ataxia and profound confusion.  Initially it was unclear whether or not the patient symptoms began less than 24 hours ago.  She has a resting tremor that is fairly significant so I had ordered stroke labs and head CT to begin and as the patient was outside the window for TPA but potentially within the window for clot retrieval I ordered a CT angiogram of the head neck and a CT perfusion.  The patient was unable to tolerate the CT scan secondary to persistent tremor so she was given 2 mg of lorazepam which did not help.  Given my concern for a life-threatening stroke decision was made with verbal consent at the patient and her daughter to provide procedural sedation with ketamine.  I administered 100 mg of ketamine and  escorted the patient personally to the CT scan and never left her bedside.  After 100 mg of ketamine the patient was more calm however continued to have tremors and erratic  movements making CT and possible.  I then administered another 100 mg and we were able to obtain a noncontrast head CT although it was clearly going to be impossible to get an angiogram.  I then brought the patient back to her room and discussed with her daughter who was able to give a more clear history and the patient's symptoms were clearly more than 24 hours old.  As her non con head CT was negative and she remained profoundly altered we performed a lumbar puncture which was again unremarkable.  At that point I discussed with the hospitalist regarding inpatient admission for undifferentiated altered mental status.  The patient and her daughter verbalized understanding and agreement with the plan.      ____________________________________________   FINAL CLINICAL IMPRESSION(S) / ED DIAGNOSES  Final diagnoses:  Encephalopathy      NEW MEDICATIONS STARTED DURING THIS VISIT:  Current Discharge Medication List       Note:  This document was prepared using Dragon voice recognition software and may include unintentional dictation errors.    Darel Hong, MD 09/17/18 [phone]

## 2018-09-16 NOTE — ED Notes (Signed)
Patient transported to CT 

## 2018-09-16 NOTE — ED Triage Notes (Signed)
Pt brought in by daughter who states she believes her mother had a stroke this morning. Per daughter pt was found on floor by her spouse this am, spouse placed her in bed, then notified daughter this pm. Pt is ataxic, has difficulty ambulating, pt with tremors noted, is confused, alert to self only. Last normal time per daughter was yesterday. perrl 39mm brisk.

## 2018-09-16 NOTE — ED Notes (Signed)
Patient disoriented to month/year. Patient ataxic at baseline, however, much more than usual per daughter and patient. Patient's gait unsteady at baseline, however, much worse than usual per daughter.  Per husband patient's right eye deviated to right this am - no eye abnormalities at present.

## 2018-09-17 ENCOUNTER — Observation Stay: Payer: Medicare HMO

## 2018-09-17 ENCOUNTER — Encounter: Payer: Self-pay | Admitting: Internal Medicine

## 2018-09-17 ENCOUNTER — Other Ambulatory Visit: Payer: Self-pay

## 2018-09-17 ENCOUNTER — Observation Stay (HOSPITAL_BASED_OUTPATIENT_CLINIC_OR_DEPARTMENT_OTHER)
Admit: 2018-09-17 | Discharge: 2018-09-17 | Disposition: A | Discharge status: Home / Self Care | Payer: Medicare HMO | Attending: Internal Medicine | Admitting: Internal Medicine

## 2018-09-17 DIAGNOSIS — I10 Essential (primary) hypertension: Secondary | ICD-10-CM

## 2018-09-17 DIAGNOSIS — R4182 Altered mental status, unspecified: Secondary | ICD-10-CM | POA: Diagnosis present

## 2018-09-17 LAB — PROCALCITONIN: Procalcitonin: <0.1 ng/mL

## 2018-09-17 LAB — URINE DRUG SCREEN, QUALITATIVE (ARMC ONLY)
Amphetamines, Ur Screen: POSITIVE — AB
BARBITURATES, UR SCREEN: NOT DETECTED
BENZODIAZEPINE, UR SCRN: POSITIVE — AB
Cannabinoid 50 Ng, Ur ~~LOC~~: NOT DETECTED
Cocaine Metabolite,Ur ~~LOC~~: NOT DETECTED
MDMA (Ecstasy)Ur Screen: NOT DETECTED
Methadone Scn, Ur: NOT DETECTED
Opiate, Ur Screen: NOT DETECTED
Phencyclidine (PCP) Ur S: NOT DETECTED
TRICYCLIC, UR SCREEN: NOT DETECTED

## 2018-09-17 LAB — ECHOCARDIOGRAM COMPLETE
Height: 65 in
Weight: 2630.4 oz

## 2018-09-17 LAB — VITAMIN B12: Vitamin B-12: 196 pg/mL (ref 180–914)

## 2018-09-17 LAB — PROTEIN AND GLUCOSE, CSF
Glucose, CSF: 57 mg/dL (ref 40–70)
TOTAL PROTEIN, CSF: 29 mg/dL (ref 15–45)

## 2018-09-17 LAB — CSF CELL COUNT WITH DIFFERENTIAL
RBC COUNT CSF: 0 /mm3 (ref 0–3)
RBC Count, CSF: 2 /mm3 (ref 0–3)
Tube #: 1
Tube #: 4
WBC, CSF: 2 /mm3 (ref 0–5)
WBC, CSF: 2 /mm3 (ref 0–5)

## 2018-09-17 LAB — HEMOGLOBIN A1C
Hgb A1c MFr Bld: 5.4 % (ref 4.8–5.6)
Mean Plasma Glucose: 108.28 mg/dL

## 2018-09-17 LAB — LIPID PANEL
Cholesterol: 162 mg/dL (ref 0–200)
HDL: 34 mg/dL — ABNORMAL LOW (ref >40)
LDL Cholesterol: 78 mg/dL (ref 0–99)
Total CHOL/HDL Ratio: 4.8 RATIO
Triglycerides: 248 mg/dL — ABNORMAL HIGH (ref <150)
VLDL: 50 mg/dL — ABNORMAL HIGH (ref 0–40)

## 2018-09-17 LAB — TSH: TSH: 1.271 u[IU]/mL (ref 0.350–4.500)

## 2018-09-17 LAB — GLUCOSE, CAPILLARY: Glucose-Capillary: 102 mg/dL — ABNORMAL HIGH (ref 70–99)

## 2018-09-17 LAB — AMMONIA: Ammonia: 19 umol/L (ref 9–35)

## 2018-09-17 LAB — FOLATE: Folate: 16.3 ng/mL (ref >5.9)

## 2018-09-17 MED ORDER — ONDANSETRON HCL 4 MG/2ML IJ SOLN: 4.0000 mg | Freq: Four times a day (QID) | INTRAMUSCULAR | Status: DC | PRN

## 2018-09-17 MED ORDER — ENOXAPARIN SODIUM 40 MG/0.4ML ~~LOC~~ SOLN: 40.0000 mg | SUBCUTANEOUS | Status: DC

## 2018-09-17 MED ORDER — ACETAMINOPHEN 650 MG RE SUPP: 650.0000 mg | RECTAL | Status: DC | PRN

## 2018-09-17 MED ORDER — IPRATROPIUM-ALBUTEROL 0.5-2.5 (3) MG/3ML IN SOLN: 3.0000 mL | RESPIRATORY_TRACT | Status: DC | PRN

## 2018-09-17 MED ORDER — LACTATED RINGERS IV SOLN
INTRAVENOUS | Status: DC
  Administered 2018-09-17: 04:00:00 via INTRAVENOUS

## 2018-09-17 MED ORDER — BISACODYL 5 MG PO TBEC: 5.0000 mg | DELAYED_RELEASE_TABLET | Freq: Every day | ORAL | Status: DC | PRN

## 2018-09-17 MED ORDER — CHLORTHALIDONE 25 MG PO TABS
25.0000 mg | ORAL_TABLET | Freq: Every day | ORAL | Status: DC
  Administered 2018-09-18: 25 mg via ORAL
  Filled 2018-09-17: qty 1

## 2018-09-17 MED ORDER — ACETAMINOPHEN 160 MG/5ML PO SOLN
650.0000 mg | ORAL | Status: DC | PRN
  Filled 2018-09-17: qty 20.3

## 2018-09-17 MED ORDER — PNEUMOCOCCAL VAC POLYVALENT 25 MCG/0.5ML IJ INJ
0.5000 mL | INJECTION | INTRAMUSCULAR | Status: AC
  Administered 2018-09-18: 10:00:00 0.5 mL via INTRAMUSCULAR
  Filled 2018-09-17: qty 0.5

## 2018-09-17 MED ORDER — ASPIRIN 81 MG PO CHEW
81.0000 mg | CHEWABLE_TABLET | Freq: Every day | ORAL | Status: DC
  Administered 2018-09-17 – 2018-09-18 (×2): 81 mg via ORAL
  Filled 2018-09-17 (×2): qty 1

## 2018-09-17 MED ORDER — ACETAMINOPHEN 325 MG PO TABS: 650.0000 mg | ORAL_TABLET | ORAL | Status: DC | PRN

## 2018-09-17 MED ORDER — LISINOPRIL 20 MG PO TABS
20.0000 mg | ORAL_TABLET | Freq: Every day | ORAL | Status: DC
  Administered 2018-09-18: 20 mg via ORAL
  Filled 2018-09-17: qty 1

## 2018-09-17 MED ORDER — PANTOPRAZOLE SODIUM 40 MG PO TBEC
40.0000 mg | DELAYED_RELEASE_TABLET | Freq: Every day | ORAL | Status: DC
  Administered 2018-09-17 – 2018-09-18 (×2): 40 mg via ORAL
  Filled 2018-09-17 (×2): qty 1

## 2018-09-17 MED ORDER — SODIUM CHLORIDE 0.9 % IV BOLUS
1000.0000 mL | Freq: Once | INTRAVENOUS | Status: AC
  Administered 2018-09-17: 1000 mL via INTRAVENOUS

## 2018-09-17 MED ORDER — SENNOSIDES-DOCUSATE SODIUM 8.6-50 MG PO TABS: 1.0000 | ORAL_TABLET | Freq: Every evening | ORAL | Status: DC | PRN

## 2018-09-17 MED ORDER — LORAZEPAM 2 MG/ML IJ SOLN
2.0000 mg | Freq: Once | INTRAMUSCULAR | Status: AC
  Administered 2018-09-17: 2 mg via INTRAVENOUS

## 2018-09-17 MED ORDER — PRAVASTATIN SODIUM 20 MG PO TABS
10.0000 mg | ORAL_TABLET | Freq: Every day | ORAL | Status: DC
  Administered 2018-09-17: 10 mg via ORAL
  Filled 2018-09-17: qty 1

## 2018-09-17 MED ORDER — STROKE: EARLY STAGES OF RECOVERY BOOK
Freq: Once | Status: AC
  Administered 2018-09-17: 04:00:00

## 2018-09-17 MED ORDER — LORAZEPAM 2 MG/ML IJ SOLN
INTRAMUSCULAR | Status: AC
  Administered 2018-09-17: 2 mg via INTRAVENOUS
  Filled 2018-09-17: qty 1

## 2018-09-17 MED ORDER — GADOBUTROL 1 MMOL/ML IV SOLN
7.0000 mL | Freq: Once | INTRAVENOUS | Status: AC | PRN
  Administered 2018-09-17: 7 mL via INTRAVENOUS

## 2018-09-17 MED ORDER — FAMOTIDINE 20 MG PO TABS
20.0000 mg | ORAL_TABLET | Freq: Two times a day (BID) | ORAL | Status: DC
  Administered 2018-09-17 – 2018-09-18 (×3): 20 mg via ORAL
  Filled 2018-09-17 (×3): qty 1

## 2018-09-17 NOTE — ED Notes (Signed)
MD remains at beside. MD ordered 2 mg Ativan IV. RN left to obtain medication. MD continued to remain at bedside

## 2018-09-17 NOTE — ED Notes (Signed)
Admitting MD at bedside.

## 2018-09-17 NOTE — Progress Notes (Signed)
*  PRELIMINARY RESULTS* Echocardiogram 2D Echocardiogram has been performed.  [name] 09/17/2018, 11:03 AM

## 2018-09-17 NOTE — Plan of Care (Signed)
Pt is very unsteady on her feet and has tremors at baseline. Pt become more oriented as the shift progress. No signs of distress noted. Will continue to monitor.

## 2018-09-17 NOTE — ED Notes (Addendum)
Swallow screen not performed at this time as patient is confused with slurred speech.

## 2018-09-17 NOTE — Care Management Note (Addendum)
Case Management Note  Patient Details  Name: JAYLEE FREEZE MRN: [id number] Date of Birth: [birth year]  Subjective/Objective:    Admitted to [facility] under observation status with the diagnosis of altered mental status. From home. Daughter is Ethlyn Daniels [phone]). Last seen Dr. Malka So at [facility] 09/04/18. Prescriptions are filled at [facility] in [place].  No home Health. No skilled Facility. No Home oxygen. No medical equipment in the home. Takes care of all basic and instrumental activities of daily living herself, drives.  Fair-good appetite. States no falls.  Observation letter explained to Ms. Hewitt. Copy left in room                Action/Plan: Answered all questions appropriately. Will continue to follow for transition of care plans   Expected Discharge Date:                  Expected Discharge Plan:     In-House Referral:   yes  Discharge planning Services   yes  Post Acute Care Choice:    Choice offered to:     DME Arranged:    DME Agency:     HH Arranged:    HH Agency:     Status of Service:     If discussed at [employer] of Stay Meetings, dates discussed:    Additional Comments:  Shelbie Ammons, RN MSN CCM Care Management [phone] 09/17/2018, 10:33 AM

## 2018-09-17 NOTE — ED Notes (Signed)
Swallow screen not performed at this time as it is contraindicated for 2 hours post lumbar puncture.

## 2018-09-17 NOTE — H&P (Signed)
[facility] NAME: Stephanie Jones    MR#:  [id number]  DATE OF BIRTH:  [birth year]  DATE OF ADMISSION:  09/16/2018  PRIMARY CARE PHYSICIAN: System, Pcp Not In   REQUESTING/REFERRING PHYSICIAN: Darel Hong, MD  CHIEF COMPLAINT:   Chief Complaint  Patient presents with  . Altered Mental Status  . Tremors    HISTORY OF PRESENT ILLNESS:  Stephanie Jones  is a 67 y.o. female with a known history of HTN, COPD, GERD, migraine p/w AMS/disorientation, tremor, imbalance/ambulatory dysfxn, strabismus/opthalmoplegia. Pt was AAOx1 on ED arrival. At the time of my assessment, she is oriented to location, POTUS (but not year) and person. I do not appreciate tremor. Pt's responses are delayed, sometimes appropriate. 2/2 encephalopathy, Hx/ROS obtained from daughter at bedside. Pt lives w/ her husband, daughter lives separately. Pt was reportedly normal at bedtime on Saturday (09/15/2018). Pt's husband woken from sleep @~0900-1000AM on Sunday morning (09/16/2018), heard moaning; pt found down on living room floor w/ strabismus and disorientation. There was no report of tongue-biting, incontinence, shaking or foaming at the mouth. Pt's daughter visited pt @~1500PM on Sunday. Pt was noted to have difficulty accessing her phone. She was disoriented, was having difficulty w/ balance/ambulation, and was noted to have facial redness/plethora. She did not exhibit strabismus/ophthalmoplegia at that time. Pt is ill-appearing, diaphoretic, restless, confused and cannot provide comprehensive Hx/ROS. She does state having burning on urination "sometimes".  PAST MEDICAL HISTORY:   Past Medical History:  Diagnosis Date  . Cancer ([place])    breast  . COPD (chronic obstructive pulmonary disease) ([place])   . GERD (gastroesophageal reflux disease)   . Hypertension   . Migraines     PAST SURGICAL HISTORY:   Past Surgical History:  Procedure Laterality Date  .  ABDOMINAL HYSTERECTOMY      SOCIAL HISTORY:   Social History   Tobacco Use  . Smoking status: Current Every Day Smoker  . Smokeless tobacco: Never Used  Substance Use Topics  . Alcohol use: No    FAMILY HISTORY:  History reviewed. No pertinent family history.  DRUG ALLERGIES:  No Known Allergies  REVIEW OF SYSTEMS:   Review of Systems  Unable to perform ROS: Mental status change  Genitourinary: Positive for dysuria.  Musculoskeletal: Positive for falls.  Neurological: Positive for tremors and weakness.   AMS. MEDICATIONS AT HOME:   Prior to Admission medications   Medication Sig Start Date End Date Taking? Authorizing Provider  albuterol (PROVENTIL HFA) 108 (90 Base) MCG/ACT inhaler Inhale 2 puffs into the lungs every 4 (four) hours as needed for wheezing or shortness of breath.    [provider]  ALPRAZolam Duanne Moron) 0.25 MG tablet Take 1 tablet (0.25 mg total) by mouth at bedtime as needed for anxiety. 11/29/15   Lorin Picket, PA-C  azithromycin (ZITHROMAX Z-PAK) 250 MG tablet Take 2 tablets (500 mg) on  Day 1,  followed by 1 tablet (250 mg) once daily on Days 2 through 5. 01/05/18   Schuyler Amor, MD  baclofen (LIORESAL) 10 MG tablet Take 10 mg by mouth 2 (two) times daily.    [provider]  chlorthalidone (HYGROTON) 25 MG tablet Take 25 mg by mouth daily.    [provider]  fluticasone (FLONASE) 50 MCG/ACT nasal spray Place 1 spray into both nostrils daily.    [provider]  ipratropium (ATROVENT HFA) 17 MCG/ACT inhaler Inhale 2 puffs into the lungs every 4 (  four) hours as needed for wheezing.    [provider]  lamoTRIgine (LAMICTAL) 100 MG tablet Take 100 mg by mouth daily.    [provider]  lisinopril (PRINIVIL,ZESTRIL) 20 MG tablet Take 20 mg by mouth daily.    [provider]  loratadine (CLARITIN) 10 MG tablet Take 10 mg by mouth daily.    [provider]  nitrofurantoin,  macrocrystal-monohydrate, (MACROBID) 100 MG capsule Take 1 capsule (100 mg total) by mouth 2 (two) times daily. 11/29/15   Lorin Picket, PA-C  nystatin (MYCOSTATIN) 100000 UNIT/ML suspension Take 5 mLs by mouth 4 (four) times daily as needed.    [provider]  omeprazole (PRILOSEC) 20 MG capsule Take 20 mg by mouth daily.    [provider]  pravastatin (PRAVACHOL) 10 MG tablet Take 10 mg by mouth daily.    [provider]  promethazine (PHENERGAN) 25 MG tablet Take 25 mg by mouth every 6 (six) hours as needed for nausea or vomiting.    [provider]  ranitidine (ZANTAC) 150 MG tablet Take 150 mg by mouth 2 (two) times daily.    [provider]  SUMAtriptan (IMITREX) 20 MG/ACT nasal spray Place 20 mg into the nose every 2 (two) hours as needed for migraine or headache. May repeat in 2 hours if headache persists or recurs.    [provider]  tretinoin (RETIN-A) 0.025 % gel Apply topically at bedtime.    [provider]      VITAL SIGNS:  Blood pressure 134/82, pulse 81, temperature 98.1 F (36.7 C), temperature source Oral, resp. rate 14, height 5\' 5"  (1.651 m), weight 72.6 kg, SpO2 94 %.  PHYSICAL EXAMINATION:  Physical Exam Constitutional:      General: She is awake. She is not in acute distress.    Appearance: Normal appearance. She is well-developed and normal weight. She is ill-appearing and diaphoretic. She is not toxic-appearing.     Interventions: She is not intubated. HENT:     Head: Atraumatic.  Eyes:     General: Lids are normal. No scleral icterus.    Extraocular Movements: Extraocular movements intact.     Conjunctiva/sclera: Conjunctivae normal.  Neck:     Musculoskeletal: Neck supple.  Cardiovascular:     Rate and Rhythm: Normal rate and regular rhythm.  No extrasystoles are present.    Heart sounds: Normal heart sounds, S1 normal and S2 normal. Heart sounds not distant. No murmur. No friction rub. No  gallop. No S3 or S4 sounds.   Pulmonary:     Effort: Pulmonary effort is normal. No tachypnea, bradypnea, accessory muscle usage, prolonged expiration, respiratory distress or retractions. She is not intubated.     Breath sounds: Normal breath sounds. No stridor, decreased air movement or transmitted upper airway sounds. No decreased breath sounds, wheezing, rhonchi or rales.  Abdominal:     General: Bowel sounds are decreased. There is no distension.     Palpations: Abdomen is soft.     Tenderness: There is no abdominal tenderness. There is no guarding or rebound.  Musculoskeletal: Normal range of motion.        General: No swelling or tenderness.     Right lower leg: No edema.     Left lower leg: No edema.  Lymphadenopathy:     Cervical: No cervical adenopathy.  Skin:    General: Skin is warm.     Findings: No rash.  Neurological:     Mental Status: She  is easily aroused. She is disoriented and confused.  Psychiatric:        Attention and Perception: Perception normal. She is inattentive.        Mood and Affect: Mood is elated. Affect is inappropriate.        Speech: Speech is delayed and tangential.        Behavior: Behavior is agitated and slowed. Behavior is not aggressive, withdrawn, hyperactive or combative. Behavior is cooperative.        Thought Content: Thought content is not paranoid.        Cognition and Memory: Cognition is impaired. Memory is impaired.        Judgment: Judgment is impulsive.    LABORATORY PANEL:   CBC Recent Labs  Lab 09/16/18 2306  WBC 7.5  HGB 14.4  HCT 42.6  PLT 292   ------------------------------------------------------------------------------------------------------------------  Chemistries  Recent Labs  Lab 09/16/18 2306  NA 141  K 3.6  CL 104  CO2 28  GLUCOSE 99  BUN 19  CREATININE 1.04*  CALCIUM 9.6  AST 18  ALT 17  ALKPHOS 53  BILITOT 0.4    ------------------------------------------------------------------------------------------------------------------  Cardiac Enzymes Recent Labs  Lab 09/16/18 2306  TROPONINI <0.03   ------------------------------------------------------------------------------------------------------------------  RADIOLOGY:  Ct Head Wo Contrast  Result Date: 09/17/2018 CLINICAL DATA:  Found down this morning. Disoriented. Focal neuro deficit, suspect stroke. History of breast cancer, hypertension and migraine. EXAM: CT HEAD WITHOUT CONTRAST TECHNIQUE: Contiguous axial images were obtained from the base of the skull through the vertex without intravenous contrast. COMPARISON:  CT HEAD January 05, 2018 FINDINGS: Moderately motion degraded examination. BRAIN: No intraparenchymal hemorrhage, mass effect nor midline shift. Borderline parenchymal brain volume loss for age. No hydrocephalus. No acute large vascular territory infarcts. No abnormal extra-axial fluid collections. Basal cisterns are patent. VASCULAR: Unremarkable. SKULL/SOFT TISSUES: No skull fracture. No significant soft tissue swelling. ORBITS/SINUSES: The included ocular globes and orbital contents are normal.Trace paranasal sinus mucosal thickening. Mastoid air cells are well aerated. OTHER: None. IMPRESSION: 1. Moderately motion degraded examination. No acute intracranial process. 2. Borderline parenchymal brain volume loss for age. 3. Acute findings discussed with and reconfirmed by Dr.NEIL RIFENBARK on 09/17/2018 at 12:39 am. Electronically Signed   By: Elon Alas M.D.   On: 09/17/2018 00:39   IMPRESSION AND PLAN:   A/P: 60F w/ PMHx HTN, COPD, GERD, migraine p/w AMS/disorientation, tremor, imbalance/ambulatory dysfxn, strabismus/opthalmoplegia. -AMS/disorientation, tremor, imbalance/ambulatory dysfxn, strabismus/opthalmoplegia: Pt p/w 1-2d Hx AMS/confusion/disorientation, fall, tremor, strabismus/ophthalmoplegai (resolved),  imbalance/ambulatory dysfxn. At the time of my assessment, tremor is not present. Pt is oriented to person, location and POTUS, but responses are delayed/encephalopathic. She does not appear to have focal weakness, but she is not able to cooperate/follow instructions well enough for performance of thorough neurological examination. Labwork demonstrates mild dehydration/Cr elevation, but is otherwise largely unimpressive. LP performed in ED, opening pressure 14, fluid appeared grossly clear. TSH, B12, folate, ammonia, RPR, HIV, PCT, UTox pending. SIRS (-), infxn less likely. U/A pending. Cerebellar-type symptoms; TIA/CVA, vertebrobasilar insufficency possible; MRI/MRA head/neck, Echo pending. Tele, continuous cardiac monitoring. NIHSS, neuro checks, fall precautions. ASA. Lipid, HbA1c pending. IVF. Holding home Xanax, Baclofen, Lamictal. IVF. -Dehydration, Cr elevation: Cr mildly elevated, (-) AKI. IVF. -Holding most of home medications. I have resumed only the essentials. -FEN/GI: NPO for now, cardiac diet as tolerated. -DVT PPx: Lovenox. -Code status: Full code. -Disposition: Observation, < 2 midnights.   Addendum: UTox (+) amphetamines.   All the records are reviewed and case discussed with  ED provider. Management plans discussed with the patient, family and they are in agreement.  CODE STATUS: Full code.  TOTAL TIME TAKING CARE OF THIS PATIENT: 75 minutes.    Arta Silence M.D on 09/17/2018 at 1:54 AM  Between 7am to 6pm - Pager - [phone]  After 6pm go to [email or web address] - Proofreader  [facility]  Office  [phone]  CC: Primary care physician; System, Pcp Not In   Note: This dictation was prepared with Dragon dictation along with smaller phrase technology. Any transcriptional errors that result from this process are unintentional.

## 2018-09-17 NOTE — Progress Notes (Signed)
Per Dr Benjie Karvonen discontinue all stroke protocol orders. CT and MRI negative.

## 2018-09-17 NOTE — Care Management Obs Status (Signed)
[name] NOTIFICATION   Patient Details  Name: Stephanie Jones MRN: [id number] Date of Birth: 11-Dec-1950   Medicare Observation Status Notification Given:  Yes; explained to patient    Shelbie Ammons, RN 09/17/2018, 10:17 AM

## 2018-09-17 NOTE — ED Notes (Signed)
MD brought patient back to room post CT scans. Post ketamine administration patient confused with slurred, incomprehensible speech. MD remains at bedside. MD and this RN reassured patient's family that patient's current behavior normal and expected post ketamine administration.

## 2018-09-17 NOTE — ED Notes (Signed)
MD and NT at bedside for lumbar puncture

## 2018-09-17 NOTE — ED Notes (Signed)
Patient speaking in full sentences. Patient continues to be confused, however, speech no long incomprehensible.

## 2018-09-18 LAB — HIV ANTIBODY (ROUTINE TESTING W REFLEX): HIV Screen 4th Generation wRfx: NONREACTIVE

## 2018-09-18 LAB — HSV DNA BY PCR (REFERENCE LAB): HSV 1 DNA: NEGATIVE

## 2018-09-18 LAB — VDRL, CSF: VDRL Quant, CSF: NONREACTIVE

## 2018-09-18 LAB — HERPES SIMPLEX VIRUS(HSV) DNA BY PCR: HSV 2 DNA: NEGATIVE

## 2018-09-18 LAB — RPR: RPR Ser Ql: NONREACTIVE

## 2018-09-18 NOTE — Discharge Summary (Signed)
[facility] NAME: Stephanie Jones    MR#:  [id number]  DATE OF BIRTH:  Jun 04, 1951  DATE OF ADMISSION:  09/16/2018 ADMITTING PHYSICIAN: Arta Silence, MD  DATE OF DISCHARGE: 09/18/2018  PRIMARY CARE PHYSICIAN: Esmond Harps, MD    ADMISSION DIAGNOSIS:  Encephalopathy [G93.40]  DISCHARGE DIAGNOSIS:  Active Problems:   Altered mental status   SECONDARY DIAGNOSIS:   Past Medical History:  Diagnosis Date  . Cancer ([place])    breast  . COPD (chronic obstructive pulmonary disease) ([place])   . GERD (gastroesophageal reflux disease)   . Hypertension   . Migraines     HOSPITAL COURSE:  67 year old female with a history of COPD tobacco dependence who presented to the emergency room with altered mental status.  1.  Acute encephalopathy: This is due to benzodiazepine/TCA in urine toxicology.  She underwent CVA work-up all of which were negative.  Her mental status is at baseline. She also had lumbar puncture and this was normal.   2. Tobacco dependence: Patient is encouraged to quit smoking. Counseling was provided for 4 minutes.   3.  Hypertension: Patient will continue on outpatient medications including lisinopril and chlorthalidone DISCHARGE CONDITIONS AND DIET:   Stable for discharge on heart healthy diet  CONSULTS OBTAINED:  Treatment Team:  Arta Silence, MD  DRUG ALLERGIES:  No Known Allergies  DISCHARGE MEDICATIONS:   Allergies as of 09/18/2018   No Known Allergies     Medication List    TAKE these medications   ATROVENT HFA 17 MCG/ACT inhaler Generic drug:  ipratropium Inhale 2 puffs into the lungs every 4 (four) hours as needed for wheezing.   baclofen 10 MG tablet Commonly known as:  LIORESAL Take 20 mg by mouth 3 (three) times daily.   BREO ELLIPTA 100-25 MCG/INH Aepb Generic drug:  fluticasone furoate-vilanterol Inhale 1 puff into the lungs daily.   busPIRone 5 MG tablet Commonly known  as:  BUSPAR Take 15 mg by mouth 3 (three) times daily.   chlorthalidone 25 MG tablet Commonly known as:  HYGROTON Take 25 mg by mouth daily.   clobetasol cream 0.05 % Commonly known as:  TEMOVATE Apply 1 application topically 2 (two) times daily as needed. Apply to hands and feet twice daily as needed   clonazePAM 1 MG tablet Commonly known as:  KLONOPIN Take 1 mg by mouth 3 (three) times daily.   fluticasone 50 MCG/ACT nasal spray Commonly known as:  FLONASE Place 1 spray into both nostrils daily.   gabapentin 100 MG capsule Commonly known as:  NEURONTIN Take 100 mg by mouth 3 (three) times daily as needed.   lamoTRIgine 100 MG tablet Commonly known as:  LAMICTAL Take 300 mg by mouth every evening.   lisinopril 20 MG tablet Commonly known as:  PRINIVIL,ZESTRIL Take 20 mg by mouth daily.   loratadine 10 MG tablet Commonly known as:  CLARITIN Take 10 mg by mouth daily.   nystatin 100000 UNIT/ML suspension Commonly known as:  MYCOSTATIN Take 5 mLs by mouth 4 (four) times daily as needed.   omeprazole 20 MG capsule Commonly known as:  PRILOSEC Take 20 mg by mouth daily.   pravastatin 40 MG tablet Commonly known as:  PRAVACHOL Take 40 mg by mouth daily.   promethazine 25 MG tablet Commonly known as:  PHENERGAN Take 25 mg by mouth every 6 (six) hours as needed for nausea or vomiting.   PROVENTIL HFA 108 (90 Base) MCG/ACT  inhaler Generic drug:  albuterol Inhale 2 puffs into the lungs every 4 (four) hours as needed for wheezing or shortness of breath.   ranitidine 150 MG tablet Commonly known as:  ZANTAC Take 150 mg by mouth 2 (two) times daily.   SUMAtriptan 20 MG/ACT nasal spray Commonly known as:  IMITREX Place 20 mg into the nose every 2 (two) hours as needed for migraine or headache. May repeat in 2 hours if headache persists or recurs.   tiotropium 18 MCG inhalation capsule Commonly known as:  SPIRIVA Place 18 mcg into inhaler and inhale daily.    traZODone 50 MG tablet Commonly known as:  DESYREL Take 50 mg by mouth at bedtime.   tretinoin 0.025 % gel Commonly known as:  RETIN-A Apply topically at bedtime.   varenicline 1 MG tablet Commonly known as:  CHANTIX Take 1 mg by mouth 2 (two) times daily.         Today   CHIEF COMPLAINT:  No acute issues overnight.  Mental status is at baseline.   VITAL SIGNS:  Blood pressure 133/64, pulse 68, temperature (!) 97.5 F (36.4 C), temperature source Oral, resp. rate 20, height 5\' 5"  (1.651 m), weight 74.6 kg, SpO2 93 %.   REVIEW OF SYSTEMS:  Review of Systems  Constitutional: Negative.  Negative for chills, fever and malaise/fatigue.  HENT: Negative.  Negative for ear discharge, ear pain, hearing loss, nosebleeds and sore throat.   Eyes: Negative.  Negative for blurred vision and pain.  Respiratory: Negative.  Negative for cough, hemoptysis, shortness of breath and wheezing.   Cardiovascular: Negative.  Negative for chest pain, palpitations and leg swelling.  Gastrointestinal: Negative.  Negative for abdominal pain, blood in stool, diarrhea, nausea and vomiting.  Genitourinary: Negative.  Negative for dysuria.  Musculoskeletal: Negative.  Negative for back pain.  Skin: Negative.   Neurological: Negative for dizziness, tremors, speech change, focal weakness, seizures and headaches.  Endo/Heme/Allergies: Negative.  Does not bruise/bleed easily.  Psychiatric/Behavioral: Negative.  Negative for depression, hallucinations and suicidal ideas.     PHYSICAL EXAMINATION:  GENERAL:  67 y.o.-year-old patient lying in the bed with no acute distress.  NECK:  Supple, no jugular venous distention. No thyroid enlargement, no tenderness.  LUNGS: Normal breath sounds bilaterally, no wheezing, rales,rhonchi  No use of accessory muscles of respiration.  CARDIOVASCULAR: S1, S2 normal. No murmurs, rubs, or gallops.  ABDOMEN: Soft, non-tender, non-distended. Bowel sounds present. No  organomegaly or mass.  EXTREMITIES: No pedal edema, cyanosis, or clubbing.  PSYCHIATRIC: The patient is alert and oriented x 3.  SKIN: No obvious rash, lesion, or ulcer.   DATA REVIEW:   CBC Recent Labs  Lab 09/16/18 2306  WBC 7.5  HGB 14.4  HCT 42.6  PLT 292    Chemistries  Recent Labs  Lab 09/16/18 2306  NA 141  K 3.6  CL 104  CO2 28  GLUCOSE 99  BUN 19  CREATININE 1.04*  CALCIUM 9.6  AST 18  ALT 17  ALKPHOS 53  BILITOT 0.4    Cardiac Enzymes Recent Labs  Lab 09/16/18 2306  TROPONINI <0.03    Microbiology Results  @[email or web address] @  RADIOLOGY:  Ct Head Wo Contrast  Result Date: 09/17/2018 CLINICAL DATA:  Found down this morning. Disoriented. Focal neuro deficit, suspect stroke. History of breast cancer, hypertension and migraine. EXAM: CT HEAD WITHOUT CONTRAST TECHNIQUE: Contiguous axial images were obtained from the base of the skull through the vertex without intravenous contrast. COMPARISON:  CT HEAD  January 05, 2018 FINDINGS: Moderately motion degraded examination. BRAIN: No intraparenchymal hemorrhage, mass effect nor midline shift. Borderline parenchymal brain volume loss for age. No hydrocephalus. No acute large vascular territory infarcts. No abnormal extra-axial fluid collections. Basal cisterns are patent. VASCULAR: Unremarkable. SKULL/SOFT TISSUES: No skull fracture. No significant soft tissue swelling. ORBITS/SINUSES: The included ocular globes and orbital contents are normal.Trace paranasal sinus mucosal thickening. Mastoid air cells are well aerated. OTHER: None. IMPRESSION: 1. Moderately motion degraded examination. No acute intracranial process. 2. Borderline parenchymal brain volume loss for age. 3. Acute findings discussed with and reconfirmed by Dr.NEIL RIFENBARK on 09/17/2018 at 12:39 am. Electronically Signed   By: Elon Alas M.D.   On: 09/17/2018 00:39   Mr Jodene Nam Head Wo Contrast  Result Date: 09/17/2018 CLINICAL DATA:  Ataxia.   Ophthalmoplegia.  Rule out stroke. EXAM: MRI HEAD WITHOUT AND WITH CONTRAST MRA HEAD WITHOUT CONTRAST MRA NECK WITHOUT AND WITH CONTRAST TECHNIQUE: Multiplanar, multiecho pulse sequences of the brain and surrounding structures were obtained without and with intravenous contrast. Angiographic images of the Circle of Willis were obtained using MRA technique without intravenous contrast. Angiographic images of the neck were obtained using MRA technique without and with intravenous contrast. Carotid stenosis measurements (when applicable) are obtained utilizing NASCET criteria, using the distal internal carotid diameter as the denominator. CONTRAST:  7 mL Gadovist IV COMPARISON:  CT head 09/16/2018 FINDINGS: MRI HEAD FINDINGS Brain: Ventricle size and cerebral volume normal. Negative for acute infarct. Several small white matter hyperintensities bilaterally appear chronic. Normal brainstem and cerebellum. Negative for hemorrhage or mass. Normal enhancement. Vascular: Normal arterial flow voids.  Normal venous enhancement. Skull and upper cervical spine: Negative Sinuses/Orbits: Mild mucosal edema paranasal sinuses.  Normal orbit Other: None MRA HEAD FINDINGS Internal carotid artery widely patent bilaterally. Anterior and middle cerebral arteries widely patent without stenosis. Negative for vascular malformation Both vertebral arteries patent to the basilar. Left vertebral artery dominant. PICA patent bilaterally. Basilar widely patent. Moderate stenosis proximal right posterior cerebral artery. Left posterior cerebral artery widely patent. Superior cerebellar arteries are small but appear patent. MRA NECK FINDINGS Antegrade flow and carotid and vertebral arteries bilaterally. Both vertebral arteries are widely patent without stenosis or irregularity Carotid bifurcation widely patent bilaterally without stenosis or irregularity. IMPRESSION: 1. Negative for acute infarct. Mild chronic microvascular ischemic change in the  cerebral white matter. Normal brainstem 2. Negative MRA head 3. Negative MRA neck Electronically Signed   By: Franchot Gallo M.D.   On: 09/17/2018 13:03   Mr Jodene Nam Neck W Wo Contrast  Result Date: 09/17/2018 CLINICAL DATA:  Ataxia.  Ophthalmoplegia.  Rule out stroke. EXAM: MRI HEAD WITHOUT AND WITH CONTRAST MRA HEAD WITHOUT CONTRAST MRA NECK WITHOUT AND WITH CONTRAST TECHNIQUE: Multiplanar, multiecho pulse sequences of the brain and surrounding structures were obtained without and with intravenous contrast. Angiographic images of the Circle of Willis were obtained using MRA technique without intravenous contrast. Angiographic images of the neck were obtained using MRA technique without and with intravenous contrast. Carotid stenosis measurements (when applicable) are obtained utilizing NASCET criteria, using the distal internal carotid diameter as the denominator. CONTRAST:  7 mL Gadovist IV COMPARISON:  CT head 09/16/2018 FINDINGS: MRI HEAD FINDINGS Brain: Ventricle size and cerebral volume normal. Negative for acute infarct. Several small white matter hyperintensities bilaterally appear chronic. Normal brainstem and cerebellum. Negative for hemorrhage or mass. Normal enhancement. Vascular: Normal arterial flow voids.  Normal venous enhancement. Skull and upper cervical spine: Negative Sinuses/Orbits: Mild mucosal edema  paranasal sinuses.  Normal orbit Other: None MRA HEAD FINDINGS Internal carotid artery widely patent bilaterally. Anterior and middle cerebral arteries widely patent without stenosis. Negative for vascular malformation Both vertebral arteries patent to the basilar. Left vertebral artery dominant. PICA patent bilaterally. Basilar widely patent. Moderate stenosis proximal right posterior cerebral artery. Left posterior cerebral artery widely patent. Superior cerebellar arteries are small but appear patent. MRA NECK FINDINGS Antegrade flow and carotid and vertebral arteries bilaterally. Both  vertebral arteries are widely patent without stenosis or irregularity Carotid bifurcation widely patent bilaterally without stenosis or irregularity. IMPRESSION: 1. Negative for acute infarct. Mild chronic microvascular ischemic change in the cerebral white matter. Normal brainstem 2. Negative MRA head 3. Negative MRA neck Electronically Signed   By: Franchot Gallo M.D.   On: 09/17/2018 13:03   Mr Jeri Cos PI Contrast  Result Date: 09/17/2018 CLINICAL DATA:  Ataxia.  Ophthalmoplegia.  Rule out stroke. EXAM: MRI HEAD WITHOUT AND WITH CONTRAST MRA HEAD WITHOUT CONTRAST MRA NECK WITHOUT AND WITH CONTRAST TECHNIQUE: Multiplanar, multiecho pulse sequences of the brain and surrounding structures were obtained without and with intravenous contrast. Angiographic images of the Circle of Willis were obtained using MRA technique without intravenous contrast. Angiographic images of the neck were obtained using MRA technique without and with intravenous contrast. Carotid stenosis measurements (when applicable) are obtained utilizing NASCET criteria, using the distal internal carotid diameter as the denominator. CONTRAST:  7 mL Gadovist IV COMPARISON:  CT head 09/16/2018 FINDINGS: MRI HEAD FINDINGS Brain: Ventricle size and cerebral volume normal. Negative for acute infarct. Several small white matter hyperintensities bilaterally appear chronic. Normal brainstem and cerebellum. Negative for hemorrhage or mass. Normal enhancement. Vascular: Normal arterial flow voids.  Normal venous enhancement. Skull and upper cervical spine: Negative Sinuses/Orbits: Mild mucosal edema paranasal sinuses.  Normal orbit Other: None MRA HEAD FINDINGS Internal carotid artery widely patent bilaterally. Anterior and middle cerebral arteries widely patent without stenosis. Negative for vascular malformation Both vertebral arteries patent to the basilar. Left vertebral artery dominant. PICA patent bilaterally. Basilar widely patent. Moderate stenosis  proximal right posterior cerebral artery. Left posterior cerebral artery widely patent. Superior cerebellar arteries are small but appear patent. MRA NECK FINDINGS Antegrade flow and carotid and vertebral arteries bilaterally. Both vertebral arteries are widely patent without stenosis or irregularity Carotid bifurcation widely patent bilaterally without stenosis or irregularity. IMPRESSION: 1. Negative for acute infarct. Mild chronic microvascular ischemic change in the cerebral white matter. Normal brainstem 2. Negative MRA head 3. Negative MRA neck Electronically Signed   By: Franchot Gallo M.D.   On: 09/17/2018 13:03      Allergies as of 09/18/2018   No Known Allergies     Medication List    TAKE these medications   ATROVENT HFA 17 MCG/ACT inhaler Generic drug:  ipratropium Inhale 2 puffs into the lungs every 4 (four) hours as needed for wheezing.   baclofen 10 MG tablet Commonly known as:  LIORESAL Take 20 mg by mouth 3 (three) times daily.   BREO ELLIPTA 100-25 MCG/INH Aepb Generic drug:  fluticasone furoate-vilanterol Inhale 1 puff into the lungs daily.   busPIRone 5 MG tablet Commonly known as:  BUSPAR Take 15 mg by mouth 3 (three) times daily.   chlorthalidone 25 MG tablet Commonly known as:  HYGROTON Take 25 mg by mouth daily.   clobetasol cream 0.05 % Commonly known as:  TEMOVATE Apply 1 application topically 2 (two) times daily as needed. Apply to hands and feet twice daily  as needed   clonazePAM 1 MG tablet Commonly known as:  KLONOPIN Take 1 mg by mouth 3 (three) times daily.   fluticasone 50 MCG/ACT nasal spray Commonly known as:  FLONASE Place 1 spray into both nostrils daily.   gabapentin 100 MG capsule Commonly known as:  NEURONTIN Take 100 mg by mouth 3 (three) times daily as needed.   lamoTRIgine 100 MG tablet Commonly known as:  LAMICTAL Take 300 mg by mouth every evening.   lisinopril 20 MG tablet Commonly known as:  PRINIVIL,ZESTRIL Take 20  mg by mouth daily.   loratadine 10 MG tablet Commonly known as:  CLARITIN Take 10 mg by mouth daily.   nystatin 100000 UNIT/ML suspension Commonly known as:  MYCOSTATIN Take 5 mLs by mouth 4 (four) times daily as needed.   omeprazole 20 MG capsule Commonly known as:  PRILOSEC Take 20 mg by mouth daily.   pravastatin 40 MG tablet Commonly known as:  PRAVACHOL Take 40 mg by mouth daily.   promethazine 25 MG tablet Commonly known as:  PHENERGAN Take 25 mg by mouth every 6 (six) hours as needed for nausea or vomiting.   PROVENTIL HFA 108 (90 Base) MCG/ACT inhaler Generic drug:  albuterol Inhale 2 puffs into the lungs every 4 (four) hours as needed for wheezing or shortness of breath.   ranitidine 150 MG tablet Commonly known as:  ZANTAC Take 150 mg by mouth 2 (two) times daily.   SUMAtriptan 20 MG/ACT nasal spray Commonly known as:  IMITREX Place 20 mg into the nose every 2 (two) hours as needed for migraine or headache. May repeat in 2 hours if headache persists or recurs.   tiotropium 18 MCG inhalation capsule Commonly known as:  SPIRIVA Place 18 mcg into inhaler and inhale daily.   traZODone 50 MG tablet Commonly known as:  DESYREL Take 50 mg by mouth at bedtime.   tretinoin 0.025 % gel Commonly known as:  RETIN-A Apply topically at bedtime.   varenicline 1 MG tablet Commonly known as:  CHANTIX Take 1 mg by mouth 2 (two) times daily.          Management plans discussed with the patient and she is in agreement. Stable for discharge   Patient should follow up with pcp  CODE STATUS:     Code Status Orders  (From admission, onward)         Start     Ordered   09/17/18 0250  Full code  Continuous     09/17/18 0249        Code Status History    This patient has a current code status but no historical code status.      TOTAL TIME TAKING CARE OF THIS PATIENT: 38 minutes.    Note: This dictation was prepared with Dragon dictation along with  smaller phrase technology. Any transcriptional errors that result from this process are unintentional.  Kailyn Dubie M.D on 09/18/2018 at 8:40 AM  Between 7am to 6pm - Pager - [phone] After 6pm go to [email or web address] - password [email or web address]  Office  [phone]  CC: Primary care physician; Esmond Harps, MD

## 2018-09-18 NOTE — Progress Notes (Signed)
Pt discharged home with husband. IV removed. Discharge instructions reviewed. Pt and husband verbalized understanding.

## 2018-09-18 NOTE — Plan of Care (Signed)
  Problem: Education: Goal: Knowledge of General Education information will improve Description: Including pain rating scale, medication(s)/side effects and non-pharmacologic comfort measures Outcome: Progressing   Problem: Pain Managment: Goal: General experience of comfort will improve Outcome: Progressing   Problem: Safety: Goal: Ability to remain free from injury will improve Outcome: Progressing   

## 2018-09-20 LAB — CSF CULTURE W GRAM STAIN
Culture: NO GROWTH
Gram Stain: NONE SEEN

## 2018-09-20 LAB — N-METHYL-D-ASPARTATE RECPT.IGG: N-methyl-D-Aspartate Recpt.IgG: <1:10 {titer}

## 2018-09-20 LAB — CSF CULTURE

## 2019-07-02 ENCOUNTER — Ambulatory Visit: Payer: Medicare HMO | Attending: Physical Medicine and Rehabilitation | Admitting: Occupational Therapy

## 2019-07-02 ENCOUNTER — Other Ambulatory Visit: Payer: Self-pay

## 2019-07-02 DIAGNOSIS — R262 Difficulty in walking, not elsewhere classified: Secondary | ICD-10-CM | POA: Diagnosis present

## 2019-07-02 DIAGNOSIS — R41841 Cognitive communication deficit: Secondary | ICD-10-CM | POA: Insufficient documentation

## 2019-07-02 DIAGNOSIS — R2689 Other abnormalities of gait and mobility: Secondary | ICD-10-CM | POA: Diagnosis present

## 2019-07-02 DIAGNOSIS — I69115 Cognitive social or emotional deficit following nontraumatic intracerebral hemorrhage: Secondary | ICD-10-CM | POA: Diagnosis present

## 2019-07-02 DIAGNOSIS — M6281 Muscle weakness (generalized): Secondary | ICD-10-CM | POA: Insufficient documentation

## 2019-07-02 DIAGNOSIS — R278 Other lack of coordination: Secondary | ICD-10-CM | POA: Insufficient documentation

## 2019-07-04 ENCOUNTER — Ambulatory Visit: Payer: Medicare HMO

## 2019-07-04 ENCOUNTER — Other Ambulatory Visit: Payer: Self-pay

## 2019-07-04 VITALS — BP 133/53 | HR 77

## 2019-07-04 DIAGNOSIS — R262 Difficulty in walking, not elsewhere classified: Secondary | ICD-10-CM

## 2019-07-04 DIAGNOSIS — M6281 Muscle weakness (generalized): Secondary | ICD-10-CM

## 2019-07-04 DIAGNOSIS — R2689 Other abnormalities of gait and mobility: Secondary | ICD-10-CM

## 2019-07-04 NOTE — Therapy (Signed)
[place] MAIN [facility] SERVICES [address] [place], [place], [address] Phone: [phone]   Fax:  [phone]  Physical Therapy Evaluation  Patient Details  Name: Stephanie Jones MRN: [id number] Date of Birth: May 12, 1951 Referring Provider (PT): Amy Malka So   Encounter Date: 07/04/2019  PT End of Session - 07/04/19 1517    Visit Number  1    Number of Visits  17    Date for PT Re-Evaluation  08/29/19    PT Start Time  [id number]    PT Stop Time  1450    PT Time Calculation (min)  52 min    Equipment Utilized During Treatment  Gait belt    Activity Tolerance  Patient tolerated treatment well;No increased pain    Behavior During Therapy  WFL for tasks assessed/performed       Past Medical History:  Diagnosis Date  . Cancer ([place])    breast  . COPD (chronic obstructive pulmonary disease) ([place])   . GERD (gastroesophageal reflux disease)   . Hypertension   . Migraines     Past Surgical History:  Procedure Laterality Date  . ABDOMINAL HYSTERECTOMY      Vitals:   07/04/19 1400  BP: (!) 133/53  Pulse: 77     Subjective Assessment - 07/04/19 1405    Subjective  Patient is a pleasant 68 year old female who presents to PT s/p CVA, was discharged from inpatient rehab 6 days ago. She was discharged to her home with assistance from her daughter, and states that she has been mostly independent with her ADLs but requires occasional min assist. Patient states she has a right-sided headache today that has been reoccurring since her surgery, and intermittent pain in right side, like pressure, ever since her shunt implant.    Patient is accompained by:  Family member    Pertinent History  Pt is a 68 y.o. 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 Patient s/p right VPS placement was  readmitted on 06/17/19 for non-traumatic ICH with left body involvement, discharged on 06/28/19 to home with family support. Upon discharge, patient required min assist for ADLs and mobility, motor function and strength appeared WNL, and was able to ambulate 100 ft with RW.    Limitations  Walking;Standing;Lifting;House hold activities    How long can you sit comfortably?  N/A    How long can you walk comfortably?  Very tired after trip to [employer]    Diagnostic tests  CT Scan    Patient Stated Goals  Wants to return to driving and have a workout routine.    Currently in Pain?  Yes    Pain Score  5     Pain Location  Head    Pain Orientation  Right    Pain Descriptors / Indicators  Sharp    Pain Type  Acute pain    Pain Onset  More than a month ago    Pain Frequency  Intermittent    Multiple Pain Sites  Yes    Pain Score  6    Pain Location  Flank    Pain Orientation  Right    Pain Descriptors / Indicators  Sharp;Stabbing    Pain Type  Acute pain    Pain Onset  More than a month ago    Pain Frequency  Intermittent  Pain Relieving Factors  Using the bathroom, defacating.          PAIN:  5/10 on NPS Headache right side intermittently, 6-10/10 on NPS intermittent flank pain from shunt surgery (not currently during visit).   POSTURE: WNL   PROM/AROM: WNL  STRENGTH:  Graded on a 0-5 scale Muscle Group Left Right  Shoulder flex    Shoulder Abd    Shoulder Ext    Shoulder IR/ER    Elbow    Wrist/hand    Hip Flex 5 5  Hip Abd 5 5  Hip Add 4 4  Hip Ext 4+ 5  Hip IR/ER 5 5  Knee Flex 5 5  Knee Ext 5 5  Ankle DF 5 5  Ankle PF 5 5   SENSATION: Appears WNL, some discrepancy in patient answers when retested; Initially, patient felt more sensation to light strokes on LLE compared to RLE, however claims she feels the same on both sides when tested again.    SPECIAL TESTS: Deferred   FUNCTIONAL MOBILITY: Patient able to transfer chair<>standing and perform bed mobility  independently. Uses BUE for rail support when climbing/descending stairs.   BALANCE: Patient scored 55/56 on BBT; 23/24 on DGI.   COORDINATION: Finger to nose: WNL; Rapid supination<>pronation on lap: WNL; Heel<>shin: WNL.    GAIT:  Limited propulsion and arm swing, has heel-toe gait pattern. No use of AD. Some difficulty with sudden stops, exhibiting slight staggering of steps.   OUTCOME MEASURES: TEST Outcome Interpretation  5 times sit<>stand 15.49 sec >60 yo, >15 sec indicates increased risk for falls  10 meter walk test               0.85  m/s <1.0 m/s indicates increased risk for falls; limited community ambulator  Timed up and Go            8.54     sec <14 sec indicates increased risk for falls  6 minute walk test              DEFERRED 1000 feet is community Water quality scientist 55/56 <36/56 (100% risk for falls), 37-45 (80% risk for falls); 46-51 (>50% risk for falls); 52-55 (lower risk <25% of falls)  Dynamic Gait Index 23/24 Interpretation  < 19/24 = predictive of falls in the elderly,   > 22/24 = safe ambulators      Objective measurements completed on examination: See above findings.    Patient demonstrates good motivation throughout today's session. She demonstrates minimal LE weakness in her hips, and has some difficulty with maintaining single leg balance and tandem stance. Her coordination, posture, and ROM appear to be WNL. Sensation appears to be WNL, although there were some discrepancies in the patient's answers when retested. Patient was able to perform objective outcome measures, required CGA for safety. She demonstrated some difficulty with gait propulsion, limited arm swing, and sudden stops during ambulation.      PT Education - 07/04/19 1505    Education Details  POC    Person(s) Educated  Patient    Methods  Explanation    Comprehension  Verbalized understanding       PT Short Term Goals - 07/04/19 1526      PT SHORT TERM GOAL #1    Title  Patient will be independent in initial home exercise program to improve strength/mobility for better functional independence with ADLs.    Time  4    Period  Weeks  Status  New    Target Date  08/01/19      PT SHORT TERM GOAL #2   Title  --    Baseline  --    Time  --    Period  --    Status  --    Target Date  --        PT Long Term Goals - 07/04/19 1533      PT LONG TERM GOAL #1   Title  Patient will increase 10 meter walk test to >1.0 m/s as to improve gait speed to community ambulator and improve gait ability.    Baseline  07/04/19 Gait speed is 0.85 m/s.    Time  8    Period  Weeks    Status  New    Target Date  08/29/19      PT LONG TERM GOAL #2   Title  Patient will be independent in finalized home exercise program to improve strength/mobility for better functional independence with ADLs.    Baseline  --    Time  8    Period  Weeks    Status  New    Target Date  08/29/19      PT LONG TERM GOAL #3   Title  Patient will be able to balance independently on one leg for >15 sec BLE with minimal sway.    Baseline  Patient able to balance independently on each leg for 10 sec with moderate sway.    Time  8    Period  Weeks    Status  New    Target Date  08/29/19      PT LONG TERM GOAL #4   Title  Patient will improve all LE MMT grades to 5/5 to facilitate improved functional independence with ADLs and IADLs.    Baseline  Patient has 5/5 strength in both LEs except for: 4/5 hip add BLE, 4+/5 hip ext LLE.    Time  8    Period  Weeks    Status  New    Target Date  08/29/19      PT LONG TERM GOAL #5   Title  Pt will ambulate >1237feet when performing 6 MWT to indicate normalized gait speed and community ambulator.    Baseline  perform next session    Time  8    Period  Weeks    Status  New    Target Date  08/30/19           Plan - 07/04/19 1725    Clinical Impression Statement  Patient presents to PT with good motivation and participated well within  session. She demonstrates good BLE strength grossly, and AROM/PROM appears to be WNL. Patient scored 55/56 on BBT and 23/24 on DGI, indicating good balance control. She does exhibit increased difficulty with maintaining SLS and tandem stance BLE. Patient demonstrates heel-toe walking pattern with minimal gait deviations, some difficulty with propulsion, sudden stops, and limited arm swing. There appears to be some cognitive deficits present that may result in impulsive behaviors. She would benefit from continued skilled PT intervention to address the deficits outlined in this note.    Personal Factors and Comorbidities  Age;Comorbidity 3+;Behavior Pattern    Comorbidities  COPD, HTN, Hx of smoking, Hx of CVA, Hx of cancer (breast).    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/Moderate complexity    Clinical  Decision Making  Moderate    Rehab Potential  Good    PT Frequency  2x / week    PT Duration  8 weeks    PT Treatment/Interventions  ADLs/Self Care Home Management;Cryotherapy;Electrical Stimulation;Moist Heat;Traction;Ultrasound;Gait training;Stair training;Functional mobility training;Therapeutic activities;Therapeutic exercise;Balance training;Neuromuscular re-education;Patient/family education;Manual techniques;Passive range of motion;Dry needling;Orthotic Fit/Training    PT Next Visit Plan  Ask about adv. directives, assess 6MWT, possibly assess FGA, develop HEP    PT Home Exercise Plan  HEP needs development    Consulted and Agree with Plan of Care  Patient           Patient will benefit from skilled therapeutic intervention in order to improve the following deficits and impairments:  Abnormal gait, Decreased balance, Decreased endurance, Difficulty walking, Impaired sensation, Decreased cognition,  Cardiopulmonary status limiting activity, Decreased activity tolerance, Decreased strength, Impaired UE functional use, Pain  Visit Diagnosis: Other abnormalities of gait and mobility  Muscle weakness (generalized)  Difficulty in walking, not elsewhere classified     Problem List Patient Active Problem List   Diagnosis Date Noted  . Altered mental status 09/17/2018   Gerda Diss. Rosana Hoes, [place] MAIN [facility] SERVICES [address] [place], [place], [address] Phone: [phone]   Fax:  [phone]  Name: MAREAH BOBE MRN: [id number] Date of Birth: [birth year]

## 2019-07-05 NOTE — Therapy (Signed)
[place] MAIN [facility] SERVICES [address] [place], [place], [address] Phone: [phone]   Fax:  [phone]  Occupational Therapy Evaluation  Patient Details  Name: Stephanie Jones MRN: [id number] Date of Birth: 1951-03-31 No data recorded  Encounter Date: 07/02/2019  OT End of Session - 07/05/19 0935    Visit Number  1    Number of Visits  24    Date for OT Re-Evaluation  09/24/19    OT Start Time  1102    OT Stop Time  1210    OT Time Calculation (min)  68 min    Activity Tolerance  Patient tolerated treatment well    Behavior During Therapy  [facility] for tasks assessed/performed       Past Medical History:  Diagnosis Date  . Cancer ([place])    breast  . COPD (chronic obstructive pulmonary disease) ([place])   . GERD (gastroesophageal reflux disease)   . Hypertension   . Migraines     Past Surgical History:  Procedure Laterality Date  . ABDOMINAL HYSTERECTOMY      There were no vitals filed for this visit.  Subjective Assessment - 07/05/19 0933    Subjective   Patient reports she is tired of everyone telling her what to do.  She would like to be independent and back to normal    Patient Stated Goals  Patient would like to be fully independent with all tasks    Currently in Pain?  No/denies    Pain Score  0-No pain        [facility] OT Assessment - 07/05/19 [id number]      Assessment   Medical Diagnosis  Nontraumatic cortical hemorrhage of left cerebral hemisphere    Onset Date/Surgical Date  05/27/19    Hand Dominance  Right    Next MD Visit  07/11/19    Prior Therapy  yes      Restrictions   Weight Bearing Restrictions  No      Balance Screen   Has the patient fallen in the past 6 months  No    Has the patient had a decrease in activity level because of a fear of falling?   No    Is the patient reluctant to leave their home because of a fear of falling?   No      Home  Environment   Family/patient expects to be discharged to:   Private residence    Living Arrangements  Spouse/significant other   lives with her ex husband but he works during the day.    Type of Home  Mobile home    [place]  One level    Alternate Level Stairs - Number of Steps  7    Bathroom Shower/Tub  Tub/Shower unit;Curtain    Shower/tub characteristics  Curtain    Production designer, theatre/television/film    Lives With  Other (Comment)   ex husband     Prior Function   Level of Independence  Independent    Vocation  On disability    Leisure  walking, visit family      ADL   Eating/Feeding  Needs assist with cutting food    Grooming  Modified independent    Upper Body Bathing  Modified independent    Lower Body Bathing  Modified independent    Upper Body Dressing  Independent    Lower  Body Dressing  Modified independent    Toilet Transfer  Modified independent    Toileting - Clothing Manipulation  Modified independent    Tub/Shower Transfer  Supervision/safety    ADL comments  pt lives with her ex husband but daughter has been staying with her to provide assistance.  Patient is not to drive, cook or engage in complex decision making tasks.  She is doing well with basic self care tasks but requires 24 hour supervision for safety.        IADL   Prior Level of Function Shopping  independent    Shopping  Assistance for transportation    Prior Level of Function Light Housekeeping  independent    Light Housekeeping  Needs help with all home maintenance tasks    Prior Level of Function Meal Prep  independent    Meal Prep  Needs to have meals prepared and served    Prior Level of Function Sales executive  Relies on family or friends for transportation    Prior Level of Function Medication Managment  independent    Medication Management  Is not capable of dispensing or managing own medication    Prior Level of Function Financial Management  independent     Financial Management  Requires assistance;Dependent      Mobility   Mobility Status  Independent      Written Expression   Dominant Hand  Right      Vision - History   Baseline Vision  Wears glasses all the time    Additional Comments  Denies any visual changes      Cognition   Overall Cognitive Status  Impaired/Different from baseline    Attention  Sustained    Memory  Impaired    Memory Impairment  Decreased recall of new information;Decreased short term memory    Problem Solving  Impaired    Executive Function  Reasoning;Decision Making      Sensation   Light Touch  Appears Intact    Stereognosis  Appears Intact    Hot/Cold  Appears Intact      Coordination   Fine Motor Movements are Fluid and Coordinated  No    Right 9 Hole Peg Test  29    Left 9 Hole Peg Test  34    Other  numbness and tingling in bilateral UEs.       AROM   Overall AROM   Within functional limits for tasks performed;Deficits    Overall AROM Comments  LUE shoulder flexion 130 degrees, RUE WNL      Strength   Overall Strength  Deficits    Overall Strength Comments  LUE 4/5 overall, RUE 5/5      Hand Function   Right Hand Grip (lbs)  55    Right Hand Lateral Pinch  14 lbs    Right Hand 3 Point Pinch  13 lbs    Left Hand Grip (lbs)  38    Left Hand Lateral Pinch  14 lbs    Left 3 point pinch  12 lbs      Cognition: Daughter present during evaluation and states patient is now "mean" after her surgery.  Patient reports daughter is being controlling and like a "mean nurse", treating her like a child.  Discussed cognitive, emotional and role changes after an event like this and provided recommendations on ways to be able to interact with greater success with each other.   Recommended daughter  work on outlining things that need to be done for the day and communicating these items, giving the patient a choice of 2 items to perform and allow patient to decide what she will engage in.  Discussed ways to  help facilitate greater independence and safety without being so demanding or intrusive.   Patient reports she realizes her daughter is only trying to help but feels overwhelmed at times and does not want to feel like a child.  Patient and daughter would likely benefit from a referral for counseling to help with adjusting and managing recovery process.                 OT Education - 07/05/19 0934    Education Details  plan of care, role of OT, goals    Person(s) Educated  Patient;Child(ren)    Methods  Explanation    Comprehension  Verbalized understanding          OT Long Term Goals - 07/05/19 0938      OT LONG TERM GOAL #1   Title  Patient will be independent with home exercise program.    Baseline  no current program at eval    Time  6    Period  Weeks    Status  New    Target Date  08/13/19      OT LONG TERM GOAL #2   Title  Patient will complete tub/shower transfer with modified independence    Baseline  supervision at eval    Time  6    Period  Weeks    Status  New    Target Date  08/13/19      OT LONG TERM GOAL #3   Title  Patient will improve grip strength by 5# to be able to cut food independently.    Baseline  family assisting with cutting food    Time  6    Period  Weeks    Status  New    Target Date  08/13/19      OT LONG TERM GOAL #4   Title  Patient will perform light homemaking tasks with modified independence.    Baseline  requires assist at eval    Time  12    Period  Weeks    Status  New    Target Date  09/24/19      OT LONG TERM GOAL #5   Title  Paient will perform light meal prep with microwave use with supervision.    Baseline  assist at eval    Time  6    Period  Weeks    Status  New    Target Date  08/13/19      Long Term Additional Goals   Additional Long Term Goals  Yes      OT LONG TERM GOAL #6   Title  Patient will demonstrate good safety awareness with meal prep using oven and stove with supervision    Baseline   decreased safety at eval    Time  12    Status  New    Target Date  09/24/19      OT LONG TERM GOAL #7   Title  Patient will demonstrate the ability to manage her medications with supervision only.    Baseline  assist with medications for what and when to take    Time  12    Period  Weeks    Status  New    Target Date  09/24/19  OT LONG TERM GOAL #8   Title  Pt will demonstrate ability to manage finances with bill paying, account reconciliation with supervision    Baseline  dependent at eval    Time  12    Period  Weeks    Target Date  09/24/19            Plan - 07/05/19 0936    Clinical Impression Statement  Ms. Stephanie Jones is a 68 y/o female with PMHx of HTN, chronic smoker, COPD, PTSD, Bipolar, breast cancer, temporal arteritis who was admitted to [facility] on 05/27/19 with altered mental status she was found to have a tectorial hemorrhage with hydrocephalus s/p EVD placement 8/31. She is now s/p R VPS placement (9/11). Pt received IP rehab and was discharged home last week and now presents for OP OT evaluation.  Patient presents with mild muscle weakness, mild incoordination, decreased cognition in the area of memory, safety/judgement, decision making and executive functioning.  She requires supervision with basic self care tasks and 24 hour supervision and assistance for safety.  She is unable to drive,manage medications, finances, cooking or heavy household chores.  She would benefit from skilled OT services to [facility] safety and independence in daily tasks.    OT Occupational Profile and History  Detailed Assessment- Review of Records and additional review of physical, cognitive, psychosocial history related to current functional performance    Occupational performance deficits (Please refer to evaluation for details):  ADL's;IADL's;Social Participation    Body Structure / Function / Physical Skills  ADL;Coordination;UE functional use;IADL;Dexterity;FMC;ROM;Strength     Cognitive Skills  Attention;Memory;Problem Solve;Safety Awareness    Psychosocial Skills  Coping Strategies;Interpersonal Interaction;Environmental  Adaptations;Routines and Behaviors    Rehab Potential  Good    Clinical Decision Making  Limited treatment options, no task modification necessary    Comorbidities Affecting Occupational Performance:  May have comorbidities impacting occupational performance    Modification or Assistance to Complete Evaluation   No modification of tasks or assist necessary to complete eval    OT Frequency  2x / week    OT Duration  12 weeks    OT Treatment/Interventions  Self-care/ADL training;Therapeutic exercise;DME and/or AE instruction;Cognitive remediation/compensation;Neuromuscular education;Manual Therapy;Psychosocial skills training;Moist Heat;Therapeutic activities;Patient/family education;Coping strategies training;Cryotherapy    Consulted and Agree with Plan of Care  Family member/caregiver    Family Member Consulted  daughter       Patient will benefit from skilled therapeutic intervention in order to improve the following deficits and impairments:   Body Structure / Function / Physical Skills: ADL, Coordination, UE functional use, IADL, Dexterity, FMC, ROM, Strength Cognitive Skills: Attention, Memory, Problem Solve, Safety Awareness Psychosocial Skills: Coping Strategies, Interpersonal Interaction, Environmental  Adaptations, Routines and Behaviors   Visit Diagnosis: Muscle weakness (generalized)  Other lack of coordination  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 Altered mental status 09/17/2018   Achilles Dunk, OTR/L, CLT  Lovett,Amy 07/05/2019, 10:01 AM  [place] MAIN [facility] SERVICES [address] [place], [place], [address] Phone: [phone]   Fax:  [phone]  Name: Stephanie Jones MRN: [id number] Date of Birth: 14-Nov-1950

## 2019-07-08 ENCOUNTER — Ambulatory Visit: Payer: Medicare HMO | Admitting: Occupational Therapy

## 2019-07-08 ENCOUNTER — Ambulatory Visit: Payer: Medicare HMO

## 2019-07-08 ENCOUNTER — Other Ambulatory Visit: Payer: Self-pay

## 2019-07-08 ENCOUNTER — Encounter: Payer: Self-pay | Admitting: Occupational Therapy

## 2019-07-08 DIAGNOSIS — R262 Difficulty in walking, not elsewhere classified: Secondary | ICD-10-CM

## 2019-07-08 DIAGNOSIS — M6281 Muscle weakness (generalized): Secondary | ICD-10-CM | POA: Diagnosis not present

## 2019-07-08 DIAGNOSIS — R2689 Other abnormalities of gait and mobility: Secondary | ICD-10-CM

## 2019-07-08 DIAGNOSIS — R278 Other lack of coordination: Secondary | ICD-10-CM

## 2019-07-08 NOTE — Therapy (Signed)
[place] MAIN [facility] SERVICES [address] [place], [place], [address] Phone: [phone]   Fax:  [phone]  Physical Therapy Treatment  Patient Details  Name: Stephanie Jones MRN: [id number] Date of Birth: 11-07-1950 Referring Provider (PT): Amy Malka So   Encounter Date: 07/08/2019  PT End of Session - 07/08/19 1546    Visit Number  2    Number of Visits  17    Date for PT Re-Evaluation  08/29/19    PT Start Time  1314    PT Stop Time  1345    PT Time Calculation (min)  31 min    Equipment Utilized During Treatment  Gait belt    Activity Tolerance  Patient tolerated treatment well;No increased pain    Behavior During Therapy  WFL for tasks assessed/performed       Past Medical History:  Diagnosis Date  . Cancer ([place])    breast  . COPD (chronic obstructive pulmonary disease) ([place])   . GERD (gastroesophageal reflux disease)   . Hypertension   . Migraines     Past Surgical History:  Procedure Laterality Date  . ABDOMINAL HYSTERECTOMY      There were no vitals filed for this visit.  Subjective Assessment - 07/08/19 1545    Subjective  Shunt re-adjusted Friday, had some bleeding on top of it. No problems since then. Little tired from appointment. Headache 3-4/10 achy. Was late due to appointment in [place] for primary care.    Patient is accompained by:  Family member    Pertinent History  Pt is a 68 y.o. 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 Patient s/p right VPS placement was readmitted on 06/17/19 for non-traumatic ICH with left body involvement, discharged on 06/28/19 to home with family support. Upon discharge, patient required min assist for ADLs and mobility, motor function and strength appeared WNL, and was able to ambulate 100 ft with RW.    Limitations  Walking;Standing;Lifting;House hold activities    How long can you sit comfortably?  N/A    How long can you walk comfortably?  Very tired after trip to [employer]    Diagnostic tests  CT Scan    Patient Stated Goals  Wants to return to driving and have a workout routine.    Currently in Pain?  Yes    Pain Score  3     Pain Location  Head    Pain Orientation  Anterior    Pain Descriptors / Indicators  Aching;Headache    Pain Type  Acute pain    Pain Onset  More than a month ago    Pain Frequency  Constant    Aggravating Factors   n/a    Pain Relieving Factors  n/a    Effect of Pain on Daily Activities  present         Shunt re-adjusted Friday, had some bleeding on top of it. No problems since then. Little tired from appointment. Headache 3-4/10 achy. Was late due to appointment in [place] for primary care.   6 MWT: 1410 ft    Standing  airex pad: one foot on airex pad one foot 7 inch step  airex pad: 7" step toe taps 10 x each LE, cueing for decreasing velocity for improved coordination.   2" step coordination 30 second holds for coordination,  spatial awareness, and dual task Seated ABC: each LE ; challenging for patient  Patient and daughter educated on quad canes due to request for referral for one for night time and " bad days".                  PT Education - 07/08/19 1546    Education Details  exercise technique, body mechanics, stability    Person(s) Educated  Patient    Methods  Explanation;Demonstration;Tactile cues;Verbal cues    Comprehension  Need further instruction;Verbalized understanding;Returned demonstration;Verbal cues required;Tactile cues required       PT Short Term Goals - 07/04/19 1526      PT SHORT TERM GOAL #1   Title  Patient will be independent in initial home exercise program to improve strength/mobility for better functional independence with ADLs.    Time  4    Period  Weeks    Status  New    Target Date   08/01/19      PT SHORT TERM GOAL #2   Title  --    Baseline  --    Time  --    Period  --    Status  --    Target Date  --        PT Long Term Goals - 07/04/19 1533      PT LONG TERM GOAL #1   Title  Patient will increase 10 meter walk test to >1.0 m/s as to improve gait speed to community ambulator and improve gait ability.    Baseline  07/04/19 Gait speed is 0.85 m/s.    Time  8    Period  Weeks    Status  New    Target Date  08/29/19      PT LONG TERM GOAL #2   Title  Patient will be independent in finalized home exercise program to improve strength/mobility for better functional independence with ADLs.    Baseline  --    Time  8    Period  Weeks    Status  New    Target Date  08/29/19      PT LONG TERM GOAL #3   Title  Patient will be able to balance independently on one leg for >15 sec BLE with minimal sway.    Baseline  Patient able to balance independently on each leg for 10 sec with moderate sway.    Time  8    Period  Weeks    Status  New    Target Date  08/29/19      PT LONG TERM GOAL #4   Title  Patient will improve all LE MMT grades to 5/5 to facilitate improved functional independence with ADLs and IADLs.    Baseline  Patient has 5/5 strength in both LEs except for: 4/5 hip add BLE, 4+/5 hip ext LLE.    Time  8    Period  Weeks    Status  New    Target Date  08/29/19      PT LONG TERM GOAL #5   Title  Pt will ambulate >1273feet when performing 6 MWT to indicate normalized gait speed and community ambulator.    Baseline  perform next session    Time  8    Period  Weeks    Status  New    Target Date  08/29/19            Plan - 07/08/19 1549    Clinical Impression  Statement  Patient presents very late to physical therapy session limiting session duration due to having a doctor appointment in [place] prior to session. 6 minute walk test performed with patient ambulated 1410 ft without an AD and without LOB. Her coordination is functional  however limited with dual tasking resulting in instability upon additional task. Patient will benefit from skilled physical therapy to increase stability, strength, and mobility for return to PLOF.    Personal Factors and Comorbidities  Age;Comorbidity 3+;Behavior Pattern    Comorbidities  COPD, HTN, Hx of smoking, Hx of CVA, Hx of cancer (breast).    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/Moderate complexity    Rehab Potential  Good    PT Frequency  2x / week    PT Duration  8 weeks    PT Treatment/Interventions  ADLs/Self Care Home Management;Cryotherapy;Electrical Stimulation;Moist Heat;Traction;Ultrasound;Gait training;Stair training;Functional mobility training;Therapeutic activities;Therapeutic exercise;Balance training;Neuromuscular re-education;Patient/family education;Manual techniques;Passive range of motion;Dry needling;Orthotic Fit/Training    PT Next Visit Plan  quad cane    PT Home Exercise Plan  HEP needs development    Consulted and Agree with Plan of Care  Patient       Patient will benefit from skilled therapeutic intervention in order to improve the following deficits and impairments:  Abnormal gait, Decreased balance, Decreased endurance, Difficulty walking, Impaired sensation, Decreased cognition, Cardiopulmonary status limiting activity, Decreased activity tolerance, Decreased strength, Impaired UE functional use, Pain  Visit Diagnosis: Other abnormalities of gait and mobility  Muscle weakness (generalized)  Difficulty in walking, not elsewhere classified  Other lack of coordination     Problem List Patient Active Problem List   Diagnosis Date Noted  . Altered mental status 09/17/2018   Janna Arch, PT, DPT   07/08/2019, 3:50 PM  [place]  MAIN [facility] SERVICES [address] [place], [place], [address] Phone: [phone]   Fax:  [phone]  Name: RAIANA MANE MRN: [id number] Date of Birth: 03/16/1951

## 2019-07-08 NOTE — Therapy (Signed)
[place] MAIN [facility] SERVICES [address] [place], [place], [address] Phone: [phone]   Fax:  [phone]  Occupational Therapy Treatment  Patient Details  Name: Stephanie Jones MRN: [id number] Date of Birth: [birth year] No data recorded  Encounter Date: 07/08/2019  OT End of Session - 07/08/19 1517    Visit Number  2    Number of Visits  24    Date for OT Re-Evaluation  09/24/19    OT Start Time  1345    OT Stop Time  1430    OT Time Calculation (min)  45 min    Activity Tolerance  Patient tolerated treatment well    Behavior During Therapy  [facility] for tasks assessed/performed       Past Medical History:  Diagnosis Date  . Cancer ([place])    breast  . COPD (chronic obstructive pulmonary disease) ([place])   . GERD (gastroesophageal reflux disease)   . Hypertension   . Migraines     Past Surgical History:  Procedure Laterality Date  . ABDOMINAL HYSTERECTOMY      There were no vitals filed for this visit.  Subjective Assessment - 07/08/19 1513    Subjective   Pt reports she is feeling better since her shunt was revised.    Patient is accompanied by:  Family member    Patient Stated Goals  Patient would like to be fully independent with all tasks    Currently in Pain?  No/denies       Self care skills: Pt needed to go to bathroom to urinate with urgency and able to complete with supervision only for balance.  Discussed family dynamics at home to help communication between her and her daughter and son since she has had a role reversal and needs cues and assist for higher level problem solving and changes that need to occur to help her manage her VP shunt such as staying hydrated, knowing symptoms of shunt issues and decreasing caffeine intake.  She also had not eaten for 7 hours and had signs of low blood sugar at end of session and given 4 glucose tablets and water.  All symptoms had resolved by end of session and discussed bringing  snacks during days of MD appts and therapy.     Neuromuscular re-ed: Sci fit for 6 minutes at Level 4 using alternating forward and reverse every 2 minutes with attention to grasp and motor control while working on stamina.  Worked on bilateral motor integration of R and L hands using [employer] Peg board and grooved pegboard.  Pt was mildly impulsive and tried to push all washers into hand off of board instead of using 2 point pinch to pick them up.  She dropped 3 items during exercises and expressed frustration with everything being harder.                   OT Education - 07/08/19 1516    Education Details  [place] and symptoms of low blood sugar and shunt issues    Person(s) Educated  Patient;Child(ren)    Methods  Explanation    Comprehension  Verbalized understanding;Need further instruction          OT Long Term Goals - 07/05/19 [id number]      OT LONG TERM GOAL #1   Title  Patient will be independent with home exercise program.    Baseline  no current program at eval    Time  6  Period  Weeks    Status  New    Target Date  08/13/19      OT LONG TERM GOAL #2   Title  Patient will complete tub/shower transfer with modified independence    Baseline  supervision at eval    Time  6    Period  Weeks    Status  New    Target Date  08/13/19      OT LONG TERM GOAL #3   Title  Patient will improve grip strength by 5# to be able to cut food independently.    Baseline  family assisting with cutting food    Time  6    Period  Weeks    Status  New    Target Date  08/13/19      OT LONG TERM GOAL #4   Title  Patient will perform light homemaking tasks with modified independence.    Baseline  requires assist at eval    Time  12    Period  Weeks    Status  New    Target Date  09/24/19      OT LONG TERM GOAL #5   Title  Paient will perform light meal prep with microwave use with supervision.    Baseline  assist at eval    Time  6    Period  Weeks    Status  New     Target Date  08/13/19      Long Term Additional Goals   Additional Long Term Goals  Yes      OT LONG TERM GOAL #6   Title  Patient will demonstrate good safety awareness with meal prep using oven and stove with supervision    Baseline  decreased safety at eval    Time  12    Status  New    Target Date  09/24/19      OT LONG TERM GOAL #7   Title  Patient will demonstrate the ability to manage her medications with supervision only.    Baseline  assist with medications for what and when to take    Time  12    Period  Weeks    Status  New    Target Date  09/24/19      OT LONG TERM GOAL #8   Title  Pt will demonstrate ability to manage finances with bill paying, account reconciliation with supervision    Baseline  dependent at eval    Time  12    Period  Weeks    Target Date  09/24/19            Plan - 07/08/19 1517    Clinical Impression Statement  Ms. Stephanie Jones had her shunt reprogrammed last week and is feeling much better.  Discussed importance of staying hydrated and not drinking too much caffeine since her daughter said she drinks coffee all day.  Reviewed symptoms of shunt failure with patient and her daughter and son at end of session.  Patient presents with mild muscle weakness, mild incoordination, decreased cognition in the area of memory, safety/judgement, decision making and executive functioning.  She requires supervision with basic self care tasks and 24 hour supervision and assistance for safety.  She is unable to drive,manage medications, finances, cooking or heavy household chores.  She did well with bilateral motor integration tasks today and Sci Fit for 6 minutes on level 4 alternating forward and reverse motions every 2 minutes but did have  signs of low blood sugar at end of session and given 4 sugar tablets and water with symptoms resolved by end of session.  Pt had not eaten anything for 7 hours and discussed taking snacks when having MD appointments and  therapy.    OT Occupational Profile and History  Problem Focused Assessment - Including review of records relating to presenting problem    Occupational performance deficits (Please refer to evaluation for details):  ADL's;IADL's;Social Participation    Body Structure / Function / Physical Skills  ADL;Coordination;UE functional use;IADL;Dexterity;FMC;ROM;Strength    Cognitive Skills  Problem Solve;Safety Awareness    Psychosocial Skills  Coping Strategies;Interpersonal Interaction;Environmental  Adaptations;Routines and Behaviors    Rehab Potential  Good    Clinical Decision Making  Limited treatment options, no task modification necessary    Comorbidities Affecting Occupational Performance:  May have comorbidities impacting occupational performance    Modification or Assistance to Complete Evaluation   No modification of tasks or assist necessary to complete eval    OT Frequency  2x / week    OT Duration  12 weeks    OT Treatment/Interventions  Self-care/ADL training;Therapeutic exercise;DME and/or AE instruction;Cognitive remediation/compensation;Neuromuscular education;Manual Therapy;Psychosocial skills training;Moist Heat;Therapeutic activities;Patient/family education;Coping strategies training;Cryotherapy    Consulted and Agree with Plan of Care  Family member/caregiver;Patient    Family Member Consulted  daughter and son       Patient will benefit from skilled therapeutic intervention in order to improve the following deficits and impairments:   Body Structure / Function / Physical Skills: ADL, Coordination, UE functional use, IADL, Dexterity, FMC, ROM, Strength Cognitive Skills: Problem Solve, Safety Awareness Psychosocial Skills: Coping Strategies, Interpersonal Interaction, Environmental  Adaptations, Routines and Behaviors   Visit Diagnosis: Muscle weakness (generalized)  Other lack of coordination    Problem List Patient Active Problem List   Diagnosis Date Noted  .  Altered mental status 09/17/2018    Chrys Racer, OTR/L, [facility] ascom [phone] 07/08/19, 3:32 PM  [place] MAIN [facility] SERVICES [address] [place], [place], [address] Phone: [phone]   Fax:  [phone]  Name: Stephanie Jones MRN: [id number] Date of Birth: [birth year]

## 2019-07-11 ENCOUNTER — Ambulatory Visit: Payer: Medicare HMO | Admitting: Occupational Therapy

## 2019-07-11 ENCOUNTER — Other Ambulatory Visit: Payer: Self-pay

## 2019-07-11 ENCOUNTER — Encounter: Payer: Self-pay | Admitting: Physical Therapy

## 2019-07-11 ENCOUNTER — Ambulatory Visit: Payer: Medicare HMO | Admitting: Physical Therapy

## 2019-07-11 VITALS — BP 137/70

## 2019-07-11 DIAGNOSIS — R278 Other lack of coordination: Secondary | ICD-10-CM

## 2019-07-11 DIAGNOSIS — I69115 Cognitive social or emotional deficit following nontraumatic intracerebral hemorrhage: Secondary | ICD-10-CM

## 2019-07-11 DIAGNOSIS — M6281 Muscle weakness (generalized): Secondary | ICD-10-CM

## 2019-07-11 DIAGNOSIS — R2689 Other abnormalities of gait and mobility: Secondary | ICD-10-CM

## 2019-07-11 DIAGNOSIS — R262 Difficulty in walking, not elsewhere classified: Secondary | ICD-10-CM

## 2019-07-11 NOTE — Therapy (Signed)
[place] MAIN [facility] SERVICES [address] [place], [place], [address] Phone: [phone]   Fax:  [phone]  Physical Therapy Treatment  Patient Details  Name: Stephanie Jones MRN: [id number] Date of Birth: 1951-01-02 Referring Provider (PT): Amy Malka So   Encounter Date: 07/11/2019  PT End of Session - 07/11/19 1319    Visit Number  3    Number of Visits  17    Date for PT Re-Evaluation  08/29/19    PT Start Time  [id number]    PT Stop Time  1345    PT Time Calculation (min)  33 min    Equipment Utilized During Treatment  Gait belt    Activity Tolerance  Patient tolerated treatment well;No increased pain    Behavior During Therapy  WFL for tasks assessed/performed       Past Medical History:  Diagnosis Date  . Cancer ([place])    breast  . COPD (chronic obstructive pulmonary disease) ([place])   . GERD (gastroesophageal reflux disease)   . Hypertension   . Migraines     Past Surgical History:  Procedure Laterality Date  . ABDOMINAL HYSTERECTOMY      Vitals:   07/11/19 1315  BP: 137/70    Subjective Assessment - 07/11/19 1317    Subjective  . Headache 5/10 achy. Was late to appointment.    Patient is accompained by:  Family member    Pertinent History  Pt is a 68 y.o. 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 Patient s/p right VPS placement was readmitted on 06/17/19 for non-traumatic ICH with left body involvement, discharged on 06/28/19 to home with family support. Upon discharge, patient required min assist for ADLs and mobility, motor function and strength appeared WNL, and was able to ambulate 100 ft with RW.    Limitations  Walking;Standing;Lifting;House hold activities    How long can you sit comfortably?  N/A    How long can you walk comfortably?  Very tired after  trip to [employer]    Diagnostic tests  CT Scan    Patient Stated Goals  Wants to return to driving and have a workout routine.    Currently in Pain?  Yes    Pain Score  5     Pain Location  Head    Pain Orientation  Anterior    Pain Descriptors / Indicators  Aching    Pain Onset  More than a month ago    Pain Onset  More than a month ago       Treatment: Standing on purple foam and step ups to 6 inch stool x 20   Standing on purple foam and side stepping to 6 inch stool left and right x 20   Standing on 1/2 foam flat side up x 2 mins  Standing on 1/2 foam flat side down and balloon tapping mirror x 20 x 3 sets   One LE on purple foam, other LE on stool and trunk rotation left and right with hands together x 20   Leg press x 20 x 3 sets  75 lbs, cues not to snap knees  HEP: Heel raise with 3 sec hold x 20 Squats x 10    Pt educated throughout session about proper posture and technique with exercises. Improved exercise technique, movement at target joints, use of target  muscles after min to mod verbal, visual, tactile cues.                      PT Education - 07/11/19 1316    Education Details  plan of care    Person(s) Educated  Patient    Methods  Explanation    Comprehension  Verbalized understanding       PT Short Term Goals - 07/04/19 1526      PT SHORT TERM GOAL #1   Title  Patient will be independent in initial home exercise program to improve strength/mobility for better functional independence with ADLs.    Time  4    Period  Weeks    Status  New    Target Date  08/01/19      PT SHORT TERM GOAL #2   Title  --    Baseline  --    Time  --    Period  --    Status  --    Target Date  --        PT Long Term Goals - 07/04/19 1533      PT LONG TERM GOAL #1   Title  Patient will increase 10 meter walk test to >1.0 m/s as to improve gait speed to community ambulator and improve gait ability.    Baseline  07/04/19 Gait speed is 0.85 m/s.     Time  8    Period  Weeks    Status  New    Target Date  08/29/19      PT LONG TERM GOAL #2   Title  Patient will be independent in finalized home exercise program to improve strength/mobility for better functional independence with ADLs.    Baseline  --    Time  8    Period  Weeks    Status  New    Target Date  08/29/19      PT LONG TERM GOAL #3   Title  Patient will be able to balance independently on one leg for >15 sec BLE with minimal sway.    Baseline  Patient able to balance independently on each leg for 10 sec with moderate sway.    Time  8    Period  Weeks    Status  New    Target Date  08/29/19      PT LONG TERM GOAL #4   Title  Patient will improve all LE MMT grades to 5/5 to facilitate improved functional independence with ADLs and IADLs.    Baseline  Patient has 5/5 strength in both LEs except for: 4/5 hip add BLE, 4+/5 hip ext LLE.    Time  8    Period  Weeks    Status  New    Target Date  08/29/19      PT LONG TERM GOAL #5   Title  Pt will ambulate >1275feet when performing 6 MWT to indicate normalized gait speed and community ambulator.    Baseline  perform next session    Time  8    Period  Weeks    Status  New    Target Date  08/29/19            Plan - 07/11/19 1322    Clinical Impression Statement  Patient presents 10 mins late to PT appointment. She is instructed in HEP and closed chain standing exercises. She reports 5/10 head ache that does not get worse during intervientions. Patient  will continue to benefit from skilled PT to improve balance and mobility.    Personal Factors and Comorbidities  Age;Comorbidity 3+;Behavior Pattern    Comorbidities  COPD, HTN, Hx of smoking, Hx of CVA, Hx of cancer (breast).    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/Moderate  complexity    Rehab Potential  Good    PT Frequency  2x / week    PT Duration  8 weeks    PT Treatment/Interventions  ADLs/Self Care Home Management;Cryotherapy;Electrical Stimulation;Moist Heat;Traction;Ultrasound;Gait training;Stair training;Functional mobility training;Therapeutic activities;Therapeutic exercise;Balance training;Neuromuscular re-education;Patient/family education;Manual techniques;Passive range of motion;Dry needling;Orthotic Fit/Training    PT Next Visit Plan  quad cane    PT Home Exercise Plan  HEP needs development    Consulted and Agree with Plan of Care  Patient       Patient will benefit from skilled therapeutic intervention in order to improve the following deficits and impairments:  Abnormal gait, Decreased balance, Decreased endurance, Difficulty walking, Impaired sensation, Decreased cognition, Cardiopulmonary status limiting activity, Decreased activity tolerance, Decreased strength, Impaired UE functional use, Pain  Visit Diagnosis: Muscle weakness (generalized)  Other lack of coordination  Other abnormalities of gait and mobility  Difficulty in walking, not elsewhere classified  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 Altered mental status 09/17/2018    Alanson Puls, PT DPT 07/11/2019, 1:25 PM  [place] MAIN [facility] SERVICES [address] [place], [place], [address] Phone: [phone]   Fax:  [phone]  Name: NADIE SICINSKI MRN: [id number] Date of Birth: [birth year]

## 2019-07-11 NOTE — Therapy (Signed)
[place] MAIN [facility] SERVICES [address] [place], [place], [address] Phone: [phone]   Fax:  [phone]  Occupational Therapy Treatment  Patient Details  Name: Stephanie Jones MRN: [id number] Date of Birth: Jun 13, 1951 No data recorded  Encounter Date: 07/11/2019  OT End of Session - 07/11/19 1400    Visit Number  3    Number of Visits  24    Date for OT Re-Evaluation  09/24/19    OT Start Time  1345    OT Stop Time  1430    OT Time Calculation (min)  45 min    Activity Tolerance  Patient tolerated treatment well    Behavior During Therapy  [facility] for tasks assessed/performed       Past Medical History:  Diagnosis Date  . Cancer ([place])    breast  . COPD (chronic obstructive pulmonary disease) ([place])   . GERD (gastroesophageal reflux disease)   . Hypertension   . Migraines     Past Surgical History:  Procedure Laterality Date  . ABDOMINAL HYSTERECTOMY      There were no vitals filed for this visit.  Subjective Assessment - 07/11/19 1354    Subjective   Pt reports feeling more unbalanced today.    Patient is accompanied by:  Family member    Patient Stated Goals  Patient would like to be fully independent with all tasks    Currently in Pain?  No/denies      Therapeutic exercises:  Pt completed 3 sets of 10 using yellow Digi flex for R hand strengthening which is 1.5 # with min cues for finger placement to maintain position and selective movements of each digit vs more of a gross grasp.  Attempted to use blue DigiFlex 7# but this was too difficult for her.  Neuromuscular re-ed: Patient had difficulty with alternating right and left hands to place and remove various heights of wooden dowels and mod cues to keep track of directions.  She progressed from gripper with white spring from 3rd to 4th slot (17.9# to 23.4#) using right hand to grasp, hold, maintain hold and move to container with control and awareness of position of RUE  and hand with mod cues.    Provided support and resources for patient and her daughter about signs of shunt failure and rec they keep a log of symptoms so her daughter does not become hyper vigilant about any new symptom patient has.  Rec support group through the [employer] on Facebook.                       OT Education - 07/11/19 1356    Education Details  [place] skills    Person(s) Educated  Patient    Methods  Explanation    Comprehension  Verbalized understanding;Need further instruction          OT Long Term Goals - 07/05/19 0938      OT LONG TERM GOAL #1   Title  Patient will be independent with home exercise program.    Baseline  no current program at eval    Time  6    Period  Weeks    Status  New    Target Date  08/13/19      OT LONG TERM GOAL #2   Title  Patient will complete tub/shower transfer with modified independence    Baseline  supervision at eval    Time  6  Period  Weeks    Status  New    Target Date  08/13/19      OT LONG TERM GOAL #3   Title  Patient will improve grip strength by 5# to be able to cut food independently.    Baseline  family assisting with cutting food    Time  6    Period  Weeks    Status  New    Target Date  08/13/19      OT LONG TERM GOAL #4   Title  Patient will perform light homemaking tasks with modified independence.    Baseline  requires assist at eval    Time  12    Period  Weeks    Status  New    Target Date  09/24/19      OT LONG TERM GOAL #5   Title  Paient will perform light meal prep with microwave use with supervision.    Baseline  assist at eval    Time  6    Period  Weeks    Status  New    Target Date  08/13/19      Long Term Additional Goals   Additional Long Term Goals  Yes      OT LONG TERM GOAL #6   Title  Patient will demonstrate good safety awareness with meal prep using oven and stove with supervision    Baseline  decreased safety at eval    Time  12     Status  New    Target Date  09/24/19      OT LONG TERM GOAL #7   Title  Patient will demonstrate the ability to manage her medications with supervision only.    Baseline  assist with medications for what and when to take    Time  12    Period  Weeks    Status  New    Target Date  09/24/19      OT LONG TERM GOAL #8   Title  Pt will demonstrate ability to manage finances with bill paying, account reconciliation with supervision    Baseline  dependent at eval    Time  12    Period  Weeks    Target Date  09/24/19            Plan - 07/11/19 1402    Clinical Impression Statement  Ms. Anysia Jaquay reports feeling more off balance today but was eager to work on strengthening and fine motor skills especially with R hand. Patient presents with mild muscle weakness, mild incoordination, decreased cognition in the area of memory, safety/judgement, decision making and executive functioning.  She requires supervision with basic self care tasks and 24 hour supervision and assistance for safety.  She is managing her own medications now in pill box.  She is unable to drive which conitnues to frustrate her.  She needs assist for cooking and heavy household chores.  She did well with bilateral motor integration tasks today and visual scanning and fine motor control activiites.  She is having a hard time with recall and alternating left and right hands for [facility] skills.  Good participation in hand strengtening and was c/o feeling hot during session and temp in room not warm.    OT Occupational Profile and History  Detailed Assessment- Review of Records and additional review of physical, cognitive, psychosocial history related to current functional performance    Occupational performance deficits (Please refer to evaluation for details):  ADL's;IADL's;Social Participation    Body Structure / Function / Physical Skills  ADL;Coordination;UE functional use;IADL;Dexterity;FMC;ROM;Strength    Cognitive Skills   Problem Solve;Safety Awareness    Psychosocial Skills  Coping Strategies;Interpersonal Interaction;Environmental  Adaptations;Routines and Behaviors    Rehab Potential  Good    Clinical Decision Making  Limited treatment options, no task modification necessary    Comorbidities Affecting Occupational Performance:  May have comorbidities impacting occupational performance    Modification or Assistance to Complete Evaluation   No modification of tasks or assist necessary to complete eval    OT Frequency  2x / week    OT Duration  12 weeks    OT Treatment/Interventions  Self-care/ADL training;Therapeutic exercise;DME and/or AE instruction;Cognitive remediation/compensation;Neuromuscular education;Manual Therapy;Psychosocial skills training;Moist Heat;Therapeutic activities;Patient/family education;Coping strategies training;Cryotherapy    Consulted and Agree with Plan of Care  Family member/caregiver;Patient       Patient will benefit from skilled therapeutic intervention in order to improve the following deficits and impairments:   Body Structure / Function / Physical Skills: ADL, Coordination, UE functional use, IADL, Dexterity, FMC, ROM, Strength Cognitive Skills: Problem Solve, Safety Awareness Psychosocial Skills: Coping Strategies, Interpersonal Interaction, Environmental  Adaptations, Routines and Behaviors   Visit Diagnosis: Muscle weakness (generalized)  Other lack of coordination    Problem List Patient Active Problem List   Diagnosis Date Noted  . Altered mental status 09/17/2018    Chrys Racer, OTR/L, [facility] ascom [phone] 07/11/19, 2:56 PM  [place] MAIN [facility] SERVICES [address] [place], [place], [address] Phone: [phone]   Fax:  [phone]  Name: ETHYL GAIN MRN: [id number] Date of Birth: 1951-03-27

## 2019-07-16 ENCOUNTER — Ambulatory Visit: Payer: Medicare HMO | Admitting: Occupational Therapy

## 2019-07-16 ENCOUNTER — Encounter: Payer: Self-pay | Admitting: Physical Therapy

## 2019-07-16 ENCOUNTER — Ambulatory Visit: Payer: Medicare HMO | Admitting: Physical Therapy

## 2019-07-16 ENCOUNTER — Other Ambulatory Visit: Payer: Self-pay

## 2019-07-16 VITALS — BP 138/57

## 2019-07-16 DIAGNOSIS — R278 Other lack of coordination: Secondary | ICD-10-CM

## 2019-07-16 DIAGNOSIS — M6281 Muscle weakness (generalized): Secondary | ICD-10-CM

## 2019-07-16 DIAGNOSIS — I69115 Cognitive social or emotional deficit following nontraumatic intracerebral hemorrhage: Secondary | ICD-10-CM

## 2019-07-16 DIAGNOSIS — R262 Difficulty in walking, not elsewhere classified: Secondary | ICD-10-CM

## 2019-07-16 DIAGNOSIS — R2689 Other abnormalities of gait and mobility: Secondary | ICD-10-CM

## 2019-07-16 NOTE — Therapy (Signed)
[place] MAIN [facility] SERVICES [address] [place], [place], [address] Phone: [phone]   Fax:  [phone]  Physical Therapy Treatment  Patient Details  Name: KENJA POLKINGHORNE MRN: [id number] Date of Birth: May 03, 1951 Referring Provider (PT): Amy Malka So   Encounter Date: 07/16/2019  PT End of Session - 07/16/19 1021    Visit Number  4    Number of Visits  17    Date for PT Re-Evaluation  08/29/19    PT Start Time  [id number]    PT Stop Time  1055    PT Time Calculation (min)  40 min    Equipment Utilized During Treatment  Gait belt    Activity Tolerance  Patient tolerated treatment well;No increased pain    Behavior During Therapy  WFL for tasks assessed/performed       Past Medical History:  Diagnosis Date  . Cancer ([place])    breast  . COPD (chronic obstructive pulmonary disease) ([place])   . GERD (gastroesophageal reflux disease)   . Hypertension   . Migraines     Past Surgical History:  Procedure Laterality Date  . ABDOMINAL HYSTERECTOMY      Vitals:   07/16/19 1021  BP: (!) 138/57    Subjective Assessment - 07/16/19 1021    Subjective  Patient is doing well today, no head ache    Patient is accompained by:  Family member    Pertinent History  Pt is a 68 y.o. 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 Patient s/p right VPS placement was readmitted on 06/17/19 for non-traumatic ICH with left body involvement, discharged on 06/28/19 to home with family support. Upon discharge, patient required min assist for ADLs and mobility, motor function and strength appeared WNL, and was able to ambulate 100 ft with RW.    Limitations  Walking;Standing;Lifting;House hold activities    How long can you sit comfortably?  N/A    How long can you walk comfortably?  Very tired after  trip to [employer]    Diagnostic tests  CT Scan    Patient Stated Goals  Wants to return to driving and have a workout routine.    Currently in Pain?  No/denies    Pain Score  0-No pain    Pain Onset  More than a month ago    Pain Onset  More than a month ago       Neuromuscular Re-education  Matrix x 5 each direction fwd/bwd/side to side , 22. 5 lbs with CGA and cues for slowing down movement  Ther-ex  Octane fitness x 5 mins  Hip flexion marches with 2# ankle weights x 10 bilateral; Hip abduction with 2# x 10 bilateral Hip extension with 2# x 10 bilateral Quantum leg press 95# x 20 x 2 sets , heel raises 45 lbs x 20 x 2  Bridges x 20  Squats x 15 with cues for correct posture   Patient needs occasional verbal cueing to improve posture and cueing to correctly perform exercises slowly, holding at end of range to increase motor firing of desired muscle to encourage fatigue.  Pt educated throughout session about proper posture and technique with exercises. Improved exercise technique, movement at target joints, use of target muscles after min to mod verbal, visual, tactile cues. Marland Kitchen  PT Education - 07/16/19 1021    Education Details  HEP    Person(s) Educated  Patient    Methods  Explanation;Demonstration    Comprehension  Returned demonstration;Verbalized understanding       PT Short Term Goals - 07/04/19 1526      PT SHORT TERM GOAL #1   Title  Patient will be independent in initial home exercise program to improve strength/mobility for better functional independence with ADLs.    Time  4    Period  Weeks    Status  New    Target Date  08/01/19      PT SHORT TERM GOAL #2   Title  --    Baseline  --    Time  --    Period  --    Status  --    Target Date  --        PT Long Term Goals - 07/04/19 1533      PT LONG TERM GOAL #1   Title  Patient will increase 10 meter walk test to >1.0 m/s as to improve gait speed to community  ambulator and improve gait ability.    Baseline  07/04/19 Gait speed is 0.85 m/s.    Time  8    Period  Weeks    Status  New    Target Date  08/29/19      PT LONG TERM GOAL #2   Title  Patient will be independent in finalized home exercise program to improve strength/mobility for better functional independence with ADLs.    Baseline  --    Time  8    Period  Weeks    Status  New    Target Date  08/29/19      PT LONG TERM GOAL #3   Title  Patient will be able to balance independently on one leg for >15 sec BLE with minimal sway.    Baseline  Patient able to balance independently on each leg for 10 sec with moderate sway.    Time  8    Period  Weeks    Status  New    Target Date  08/29/19      PT LONG TERM GOAL #4   Title  Patient will improve all LE MMT grades to 5/5 to facilitate improved functional independence with ADLs and IADLs.    Baseline  Patient has 5/5 strength in both LEs except for: 4/5 hip add BLE, 4+/5 hip ext LLE.    Time  8    Period  Weeks    Status  New    Target Date  08/29/19      PT LONG TERM GOAL #5   Title  Pt will ambulate >1231feet when performing 6 MWT to indicate normalized gait speed and community ambulator.    Baseline  perform next session    Time  8    Period  Weeks    Status  New    Target Date  08/29/19            Plan - 07/16/19 1022    Clinical Impression Statement Patient instructed in intermediate balance challenges and therapeutic exercise.  Patient required min-mod VCs for correct positioning; Patient had increased difficulty with dynamic steps and dule task movements especially unsupported. Patient would benefit from additional skilled PT intervention to improve strength,and balance.    Personal Factors and Comorbidities  Age;Comorbidity 3+;Behavior Pattern    Comorbidities  COPD, HTN, Hx of smoking,  Hx of CVA, Hx of cancer (breast).    Examination-Activity Limitations  Carry;Locomotion Level;Stairs;Stand     Examination-Participation Restrictions  Cleaning;Community Activity;Driving;Laundry;Shop;Yard Work    [employer]  Evolving/Moderate complexity    Rehab Potential  Good    PT Frequency  2x / week    PT Duration  8 weeks    PT Treatment/Interventions  ADLs/Self Care Home Management;Cryotherapy;Electrical Stimulation;Moist Heat;Traction;Ultrasound;Gait training;Stair training;Functional mobility training;Therapeutic activities;Therapeutic exercise;Balance training;Neuromuscular re-education;Patient/family education;Manual techniques;Passive range of motion;Dry needling;Orthotic Fit/Training    PT Next Visit Plan  quad cane    PT Home Exercise Plan  HEP needs development    Consulted and Agree with Plan of Care  Patient       Patient will benefit from skilled therapeutic intervention in order to improve the following deficits and impairments:  Abnormal gait, Decreased balance, Decreased endurance, Difficulty walking, Impaired sensation, Decreased cognition, Cardiopulmonary status limiting activity, Decreased activity tolerance, Decreased strength, Impaired UE functional use, Pain  Visit Diagnosis: Muscle weakness (generalized)  Other lack of coordination  Other abnormalities of gait and mobility  Difficulty in walking, not elsewhere classified  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 Altered mental status 09/17/2018    Alanson Puls, PT DPT 07/16/2019, 11:05 AM  [place] MAIN [facility] SERVICES [address] [place], [place], [address] Phone: [phone]   Fax:  [phone]  Name: NINETTE WURZEL MRN: [id number] Date of Birth: 1951-03-18

## 2019-07-16 NOTE — Patient Instructions (Signed)
Heel Raise: Bilateral (Standing)    Rise on balls of feet. Repeat _20___ times per set. Do ___1_ sets per session. Do ___1HIP: Abduction - Standing (Band)    Place band around legs. Squeeze glutes. Raise leg out and slightly back. Hold ___ seconds. Use _red_______ band. __15_ reps per set, _1__ sets per day, __7_ days per week Hold onto a support.  Copyright  VHI. All rights reserved.  Bridge    Lie back, legs bent. Inhale, pressing hips up. Keeping ribs in, lengthen lower back. Exhale, rolling down along spine from top. Repeat _20___ times. Do __1__ sessions per day.  http://pm.exer.us/55   Copyright  VHI. All rights reserved.  Squat: Pelvic and Feet Neutral    Stand with feet shoulder width apart. Squat, bending knees and hips. Keep pelvis neutral. Do 10___ times, _1__ times per day.  http://ss.exer.us/139   Copyright  VHI. All rights reserved.  _ sessions per day.  http://orth.exer.us/39   Copyright  VHI. All rights reserved.

## 2019-07-16 NOTE — Therapy (Signed)
[place] MAIN [facility] SERVICES [address] [place], [place], [address] Phone: [phone]   Fax:  [phone]  Occupational Therapy Treatment  Patient Details  Name: Stephanie Jones MRN: [id number] Date of Birth: 07-Aug-1951 No data recorded  Encounter Date: 07/16/2019  OT End of Session - 07/16/19 1125    Visit Number  4    Number of Visits  24    Date for OT Re-Evaluation  09/24/19    OT Start Time  1100    OT Stop Time  1145    OT Time Calculation (min)  45 min    Activity Tolerance  Patient tolerated treatment well    Behavior During Therapy  [facility] for tasks assessed/performed       Past Medical History:  Diagnosis Date  . Cancer ([place])    breast  . COPD (chronic obstructive pulmonary disease) ([place])   . GERD (gastroesophageal reflux disease)   . Hypertension   . Migraines     Past Surgical History:  Procedure Laterality Date  . ABDOMINAL HYSTERECTOMY      There were no vitals filed for this visit.  Subjective Assessment - 07/16/19 1119    Subjective   Pt reports having a shadow in her right eye that come and goes but no other headache or changes in vision.    Patient is accompanied by:  Family member    Patient Stated Goals  Patient would like to be fully independent with all tasks    Currently in Pain?  Other (Comment)   intermittent pain in R mid abdomen area that has come and gone since surgery.   Takes Tylenol to help and Neurontin.      Therapeutic exercises:  Pt completed SCI Fit on Level 4 with alternating every 2 minutes forward and backwards with reminders at 2 minute marks since she was having difficulty remembering this detail.  Neuromuscular re-ed: Patient did well with stacking various coins and pressing into resistive top of container but needed extra time and cues to correctly count the coins and keep stacks of pennies in increments of 5 vs random amounts.  Cues to complete fine motor control task using R  hand with long nosed tweezers to pick up and manipulate and place 1/8 inch cylindrical beads onto small pegboard with vertical slots.  Task became more difficult as beads were closer together on pegboard. Decreased extension of R wrist noted during activity with decreased awareness.  No visual issues noted during functional tasks today and patient stated that she had this spot in her vision before shunt surgery and feels it is related to the surgery she had near her temple.  Rec consulting with a eye doctor to assess her eyes again.   Provided continued support and resources for patient and her daughter about signs of shunt failure and rec they keep a log of symptoms so her daughter does not become hyper vigilant about any new symptom patient has.  Continue to rec support group through the [employer] on Facebook.                      OT Education - 07/16/19 1122    Education Details  [place] skills    Person(s) Educated  Patient    Methods  Explanation;Demonstration    Comprehension  Verbalized understanding;Need further instruction          OT Long Term Goals - 07/05/19 [phone]  OT LONG TERM GOAL #1   Title  Patient will be independent with home exercise program.    Baseline  no current program at eval    Time  6    Period  Weeks    Status  New    Target Date  08/13/19      OT LONG TERM GOAL #2   Title  Patient will complete tub/shower transfer with modified independence    Baseline  supervision at eval    Time  6    Period  Weeks    Status  New    Target Date  08/13/19      OT LONG TERM GOAL #3   Title  Patient will improve grip strength by 5# to be able to cut food independently.    Baseline  family assisting with cutting food    Time  6    Period  Weeks    Status  New    Target Date  08/13/19      OT LONG TERM GOAL #4   Title  Patient will perform light homemaking tasks with modified independence.    Baseline  requires assist at  eval    Time  12    Period  Weeks    Status  New    Target Date  09/24/19      OT LONG TERM GOAL #5   Title  Paient will perform light meal prep with microwave use with supervision.    Baseline  assist at eval    Time  6    Period  Weeks    Status  New    Target Date  08/13/19      Long Term Additional Goals   Additional Long Term Goals  Yes      OT LONG TERM GOAL #6   Title  Patient will demonstrate good safety awareness with meal prep using oven and stove with supervision    Baseline  decreased safety at eval    Time  12    Status  New    Target Date  09/24/19      OT LONG TERM GOAL #7   Title  Patient will demonstrate the ability to manage her medications with supervision only.    Baseline  assist with medications for what and when to take    Time  12    Period  Weeks    Status  New    Target Date  09/24/19      OT LONG TERM GOAL #8   Title  Pt will demonstrate ability to manage finances with bill paying, account reconciliation with supervision    Baseline  dependent at eval    Time  12    Period  Weeks    Target Date  09/24/19            Plan - 07/16/19 1126    Clinical Impression Statement  Stephanie Jones reports she has a shadow in her R eye which comes and goes but is not obsructing her vision but hurts sometimes too.  No other symptoms of shunt issues but did have more tremors in hands and head during session. She was eager to work on strengthening and fine motor skills especially with R hand. Patient presents with mild muscle weakness, mild incoordination, decreased cognition in the area of memory, safety/judgement, decision making and executive functioning.  She is unable to drive which conitnues to frustrate her. She did well with bilateral motor integration  tasks today and visual scanning and fine motor control activiites.  She is having a hard time with recall and alternating left and right hands for [facility] skills for use with ADLs and IADLs.    OT  Occupational Profile and History  Detailed Assessment- Review of Records and additional review of physical, cognitive, psychosocial history related to current functional performance    Occupational performance deficits (Please refer to evaluation for details):  ADL's;IADL's;Social Participation    Body Structure / Function / Physical Skills  ADL;Coordination;UE functional use;IADL;Dexterity;FMC;ROM;Strength    Cognitive Skills  Problem Solve;Safety Awareness    Psychosocial Skills  Coping Strategies;Interpersonal Interaction;Environmental  Adaptations;Routines and Behaviors    Rehab Potential  Good    Clinical Decision Making  Several treatment options, min-mod task modification necessary    Comorbidities Affecting Occupational Performance:  May have comorbidities impacting occupational performance    Modification or Assistance to Complete Evaluation   Min-Moderate modification of tasks or assist with assess necessary to complete eval    OT Frequency  2x / week    OT Duration  12 weeks    OT Treatment/Interventions  Self-care/ADL training;Therapeutic exercise;DME and/or AE instruction;Cognitive remediation/compensation;Neuromuscular education;Manual Therapy;Psychosocial skills training;Moist Heat;Therapeutic activities;Patient/family education;Coping strategies training;Cryotherapy    Consulted and Agree with Plan of Care  Patient    Family Member Consulted  daughter       Patient will benefit from skilled therapeutic intervention in order to improve the following deficits and impairments:   Body Structure / Function / Physical Skills: ADL, Coordination, UE functional use, IADL, Dexterity, FMC, ROM, Strength Cognitive Skills: Problem Solve, Safety Awareness Psychosocial Skills: Coping Strategies, Interpersonal Interaction, Environmental  Adaptations, Routines and Behaviors   Visit Diagnosis: Muscle weakness (generalized)  Other lack of coordination    Problem List Patient Active  Problem List   Diagnosis Date Noted  . Altered mental status 09/17/2018    Chrys Racer, OTR/L, [facility] ascom [phone] 07/16/19, 11:49 AM  [place] MAIN [facility] SERVICES [address] [place], [place], [address] Phone: [phone]   Fax:  [phone]  Name: Stephanie Jones MRN: [id number] Date of Birth: 1950/11/14

## 2019-07-19 ENCOUNTER — Ambulatory Visit: Payer: Medicare HMO | Admitting: Speech Pathology

## 2019-07-19 ENCOUNTER — Other Ambulatory Visit: Payer: Self-pay

## 2019-07-19 ENCOUNTER — Encounter: Payer: Self-pay | Admitting: Speech Pathology

## 2019-07-19 DIAGNOSIS — M6281 Muscle weakness (generalized): Secondary | ICD-10-CM | POA: Diagnosis not present

## 2019-07-19 DIAGNOSIS — R41841 Cognitive communication deficit: Secondary | ICD-10-CM

## 2019-07-19 NOTE — Therapy (Signed)
[place] MAIN [facility] SERVICES [address] [place], [place], [address] Phone: [phone]   Fax:  [phone]  Speech Language Pathology Evaluation  Patient Details  Name: Stephanie Jones MRN: [id number] Date of Birth: Sep 25, 1951 Referring Provider (SLP): Dr. Dina Rich   Encounter Date: 07/19/2019  End of Session - 07/19/19 1213    Visit Number  1    Number of Visits  17    Date for SLP Re-Evaluation  09/13/19    Authorization Type  Medicare    Authorization Time Period  Start 07/19/2019    Authorization - Visit Number  1    Authorization - Number of Visits  10    SLP Start Time  0900    SLP Stop Time   1000    SLP Time Calculation (min)  60 min    Activity Tolerance  Patient tolerated treatment well       Past Medical History:  Diagnosis Date  . Cancer ([place])    breast  . COPD (chronic obstructive pulmonary disease) ([place])   . GERD (gastroesophageal reflux disease)   . Hypertension   . Migraines     Past Surgical History:  Procedure Laterality Date  . ABDOMINAL HYSTERECTOMY      There were no vitals filed for this visit.      SLP Evaluation [facility] - 07/19/19 0001      SLP Visit Information   SLP Received On  07/19/19    Referring Provider (SLP)  Dr. Dina Rich    Onset Date  05/27/2019    Medical Diagnosis  CVA      Subjective   Subjective  "I didn't realize I was that bad!"    Patient/Family Stated Goal  Maximize cognitive-linguistic skills       Pain Assessment   Currently in Pain?  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      Prior Functional Status   Cognitive/Linguistic Baseline  Within functional limits      Cognition   Overall Cognitive Status  Impaired/Different from baseline      Auditory Comprehension   Overall Auditory Comprehension  Appears within functional limits for tasks assessed      Reading Comprehension   Reading Status   Within funtional limits      Expression   Primary Mode of Expression  Verbal      Verbal Expression   Overall Verbal Expression  Appears within functional limits for tasks assessed      Written Expression   Written Expression  Within Functional Limits      Oral Motor/Sensory Function   Overall Oral Motor/Sensory Function  Appears within functional limits for tasks assessed      Motor Speech   Overall Motor Speech  Appears within functional limits for tasks assessed      Standardized Assessments   Standardized Assessments   Cognitive Linguistic Quick Test      Cognitive Linguistic Quick Test  Cognitive Linguistic Quick Test The Cognitive Linguistic Quick Test (CLQT) was administered to assess the relative status of five cognitive domains: attention, memory, language, executive functioning, and visuospatial skills. Scores from 10 tasks were used to estimate severity ratings (for age groups 18-69 years and 70-89 years) for each domain, a clock drawing task, as well as an overall composite severity rating of cognition.    Task    Score   Criterion Cut Scores Personal Facts  8/8    8  Symbol Cancellation  7/12 (12 given extra time) 11  Confrontation Naming    10/10    10 Clock Drawing      0/13    12 Story Retelling       7/10    6 Symbol Trails      10/10   9 Generative Naming      3/9    5 Design Memory     4/6    5 Mazes        4/8 (8 given extra time) 7  Design Generation    3/13    6  Cognitive Domain  Composite Score Severity Rating Attention   134/215  Mild (WNL given extra time)  Memory   141/185  Mild Executive Function  20/40   Mild (WNL given extra time) Language   28/37   Mild Visuospatial Skills  65/105   Mild (WNL given extra time) Composite Severity Rating Mild (WNL given extra time)  Clock Drawing   0/13   Severe    SLP Education - 07/19/19 1213    Education Details  Results and recommendations    Person(s) Educated  Patient;Child(ren)    Methods   Explanation    Comprehension  Verbalized understanding         SLP Long Term Goals - 07/19/19 1217      SLP LONG TERM GOAL #1   Title  Patient will identify cognitive-communication barriers and participate in developing functional compensatory strategies.    Time  8    Period  Weeks    Status  New    Target Date  09/13/19      SLP LONG TERM GOAL #2   Title  Patient will demonstrate functional cognitive-communication skills for independent completion of personal responsibilities and leisure activities.    Time  8    Period  Weeks    Status  New    Target Date  09/13/19      SLP LONG TERM GOAL #3   Title  Patient will complete complex executive function skills tasks with 80% accuracy.    Time  8    Period  Weeks    Status  New    Target Date  09/13/19      SLP LONG TERM GOAL #4   Title  Patient will complete complex visual-spatial activities with 80% accuracy.    Time  8    Period  Weeks    Status  New    Target Date  09/13/19       Plan - 07/19/19 1216    Clinical Impression Statement  At 2 months post onset CVA, the patient is presenting with mild cognitive communication impairment characterized by reduced awareness of deficits as well as impairments across cognitive realms.  The results of the Cognitive Linguistic Quick Test (CLQT) indicate a composite severity rating of mild.  The patient scored with mild impairment in the realms of attention, memory, executive function, language, and visuospatial skills.  When given extra time, the patient was able to improve scores in the symbol cancellation task and mazes task resulting in a composite severity rating of WNL.  The patient was not able to draw a clock.  The patient was able to acknowledge that these scores reflected a change for her.  The patient will benefit from skilled speech therapy for restorative and compensatory treatment of cognitive communication skills.    Speech Therapy Frequency  2x / week  Duration  Other  (comment)   8 weeks   Treatment/Interventions  Cognitive reorganization;SLP instruction and feedback;Patient/family education;Compensatory strategies    Potential to Achieve Goals  Good    Potential Considerations  Ability to learn/carryover information;Previous level of function;Severity of impairments;Cooperation/participation level;Medical prognosis;Family/community support    Consulted and Agree with Plan of Care  Patient;Family member/caregiver    Family Member Consulted  Daughter       Patient will benefit from skilled therapeutic intervention in order to improve the following deficits and impairments:   Cognitive communication deficit - Plan: SLP plan of care cert/re-cert    Problem List Patient Active Problem List   Diagnosis Date Noted  . Altered mental status 09/17/2018   Leroy Sea, MS/CCC- SLP  Lou Miner 07/19/2019, 12:23 PM  [place] MAIN [place] [address] [place], [place], [address] Phone: [phone]   Fax:  [phone]  Name: Stephanie Jones MRN: [id number] Date of Birth: 07-19-1951

## 2019-07-22 ENCOUNTER — Other Ambulatory Visit: Payer: Self-pay

## 2019-07-22 ENCOUNTER — Ambulatory Visit: Payer: Medicare HMO | Admitting: Occupational Therapy

## 2019-07-22 DIAGNOSIS — M6281 Muscle weakness (generalized): Secondary | ICD-10-CM | POA: Diagnosis not present

## 2019-07-22 DIAGNOSIS — R41841 Cognitive communication deficit: Secondary | ICD-10-CM

## 2019-07-22 DIAGNOSIS — R278 Other lack of coordination: Secondary | ICD-10-CM

## 2019-07-22 NOTE — Therapy (Signed)
[place] MAIN [facility] SERVICES [address] [place], [place], [address] Phone: [phone]   Fax:  [phone]  Occupational Therapy Treatment  Patient Details  Name: Stephanie Jones MRN: [id number] Date of Birth: 1951/02/23 No data recorded  Encounter Date: 07/22/2019  OT End of Session - 07/22/19 1728    Visit Number  5    Number of Visits  24    Date for OT Re-Evaluation  09/24/19    OT Start Time  1430    OT Stop Time  1515    OT Time Calculation (min)  45 min    Activity Tolerance  Patient tolerated treatment well    Behavior During Therapy  [facility] for tasks assessed/performed       Past Medical History:  Diagnosis Date  . Cancer ([place])    breast  . COPD (chronic obstructive pulmonary disease) ([place])   . GERD (gastroesophageal reflux disease)   . Hypertension   . Migraines     Past Surgical History:  Procedure Laterality Date  . ABDOMINAL HYSTERECTOMY      There were no vitals filed for this visit.  Subjective Assessment - 07/22/19 1727    Subjective   Pt. reports having an MD appointment tomorrow for follow-up from the shunt.    Patient is accompanied by:  Family member    Patient Stated Goals  Patient would like to be fully independent with all tasks    Currently in Pain?  No/denies      OT TREATMENT    Neuro muscular re-education:  Pt. worked on grasping 1" resistive cubes alternating thumb opposition to the tip of the 2nd through 5th digits while the board is placed at a vertical angle. Pt. worked on pressing the cubes back into place while alternating isolated 2nd through 5th digit extension. Pt. required extensive cues to complete the task slowly, and to follow the directions accurately. Pt. worked on [facility] skills grasping 1" sticks, 1/4" collars, and 1/4" washers. Pt. worked on storing the objects in the palm, and translatory skills moving the items from the palm of the hand to the tip of the 2nd digit, and thumb. Pt.  Attempted to remove the pegs using bilateral alternating hand patterns however was not able to follow the sequence of movements. Pt. Required cues to perform the task slowly, and to focus on the quality of movements. Pt. performed [facility] tasks using the Grooved pegboard. Pt. worked on grasping the grooved pegs from a horizontal position, and moving the pegs to a vertical position in the hand to prepare for placing them in the grooved slot.   Therapeutic Activities:  Pt. education was provided about activities to challenge her memory at home. Pt. was able to complete a 5 card sequence with increased time.    Response to Treatment:  Pt. reported blurriness in her  right eye. Pt. has a follow-up appointment with her MD about the shunt. Pt. Reports concern about her memory. Pt. required visual demonstration, and extensive/consistent cues to follow directions for the [facility] tasks. Pt. Required increased cues to perform tasks slowly, and focus on the quality of movements. Pt. was able to store onbjects in her hand without dropping them, however had difficulty with manipulating small objects, and translatory movements of the hand. Pt. continues to work on improving  [facility] skills in order to improve ADL, and IADL functioning.  OT Education - 07/22/19 1728    Education Details  [facility] skills    Person(s) Educated  Patient    Methods  Explanation;Demonstration    Comprehension  Verbalized understanding;Need further instruction          OT Long Term Goals - 07/05/19 0938      OT LONG TERM GOAL #1   Title  Patient will be independent with home exercise program.    Baseline  no current program at eval    Time  6    Period  Weeks    Status  New    Target Date  08/13/19      OT LONG TERM GOAL #2   Title  Patient will complete tub/shower transfer with modified independence    Baseline  supervision at eval    Time  6    Period  Weeks    Status  New    Target Date   08/13/19      OT LONG TERM GOAL #3   Title  Patient will improve grip strength by 5# to be able to cut food independently.    Baseline  family assisting with cutting food    Time  6    Period  Weeks    Status  New    Target Date  08/13/19      OT LONG TERM GOAL #4   Title  Patient will perform light homemaking tasks with modified independence.    Baseline  requires assist at eval    Time  12    Period  Weeks    Status  New    Target Date  09/24/19      OT LONG TERM GOAL #5   Title  Paient will perform light meal prep with microwave use with supervision.    Baseline  assist at eval    Time  6    Period  Weeks    Status  New    Target Date  08/13/19      Long Term Additional Goals   Additional Long Term Goals  Yes      OT LONG TERM GOAL #6   Title  Patient will demonstrate good safety awareness with meal prep using oven and stove with supervision    Baseline  decreased safety at eval    Time  12    Status  New    Target Date  09/24/19      OT LONG TERM GOAL #7   Title  Patient will demonstrate the ability to manage her medications with supervision only.    Baseline  assist with medications for what and when to take    Time  12    Period  Weeks    Status  New    Target Date  09/24/19      OT LONG TERM GOAL #8   Title  Pt will demonstrate ability to manage finances with bill paying, account reconciliation with supervision    Baseline  dependent at eval    Time  12    Period  Weeks    Target Date  09/24/19            Plan - 07/22/19 1729    Clinical Impression Statement Pt. reported blurriness in her  right eye. Pt. has a follow-up appointment with her MD about the shunt. Pt. Reports concern about her memory. Pt. required visual demonstration, and extensive/consistent cues to follow directions for the [facility] tasks. Pt. Required increased cues  to perform tasks slowly, and focus on the quality of movements. Pt. was able to store onbjects in her hand without dropping  them, however had difficulty with manipulating small objects, and translatory movements of the hand. Pt. continues to work on improving  [facility] skills in order to improve ADL, and IADL functioning..    OT Occupational Profile and History  Detailed Assessment- Review of Records and additional review of physical, cognitive, psychosocial history related to current functional performance    Occupational performance deficits (Please refer to evaluation for details):  ADL's;IADL's;Social Participation    Body Structure / Function / Physical Skills  ADL;Coordination;UE functional use;IADL;Dexterity;FMC;ROM;Strength    Cognitive Skills  Problem Solve;Safety Awareness    Psychosocial Skills  Coping Strategies;Interpersonal Interaction;Environmental  Adaptations;Routines and Behaviors    Rehab Potential  Good    Clinical Decision Making  Several treatment options, min-mod task modification necessary    Comorbidities Affecting Occupational Performance:  May have comorbidities impacting occupational performance    Modification or Assistance to Complete Evaluation   Min-Moderate modification of tasks or assist with assess necessary to complete eval    OT Frequency  2x / week    OT Duration  12 weeks    OT Treatment/Interventions  Self-care/ADL training;Therapeutic exercise;DME and/or AE instruction;Cognitive remediation/compensation;Neuromuscular education;Manual Therapy;Psychosocial skills training;Moist Heat;Therapeutic activities;Patient/family education;Coping strategies training;Cryotherapy    Consulted and Agree with Plan of Care  Patient    Family Member Consulted  daughter       Patient will benefit from skilled therapeutic intervention in order to improve the following deficits and impairments:   Body Structure / Function / Physical Skills: ADL, Coordination, UE functional use, IADL, Dexterity, FMC, ROM, Strength Cognitive Skills: Problem Solve, Safety Awareness Psychosocial Skills: Coping Strategies,  Interpersonal Interaction, Environmental  Adaptations, Routines and Behaviors   Visit Diagnosis: Muscle weakness (generalized)  Other lack of coordination  Cognitive communication deficit    Problem List Patient Active Problem List   Diagnosis Date Noted  . Altered mental status 09/17/2018    Harrel Carina, MS, OTR/L 07/22/2019, 5:38 PM  [place] MAIN [facility] SERVICES [address] [place], [place], [address] Phone: [phone]   Fax:  [phone]  Name: Stephanie Jones MRN: [id number] Date of Birth: October 26, 1950

## 2019-07-24 ENCOUNTER — Other Ambulatory Visit: Payer: Self-pay

## 2019-07-24 ENCOUNTER — Encounter: Payer: Self-pay | Admitting: Physical Therapy

## 2019-07-24 ENCOUNTER — Encounter: Payer: Self-pay | Admitting: Occupational Therapy

## 2019-07-24 ENCOUNTER — Ambulatory Visit: Payer: Medicare HMO | Admitting: Physical Therapy

## 2019-07-24 ENCOUNTER — Ambulatory Visit: Payer: Medicare HMO | Admitting: Occupational Therapy

## 2019-07-24 DIAGNOSIS — R41841 Cognitive communication deficit: Secondary | ICD-10-CM

## 2019-07-24 DIAGNOSIS — M6281 Muscle weakness (generalized): Secondary | ICD-10-CM

## 2019-07-24 DIAGNOSIS — I69115 Cognitive social or emotional deficit following nontraumatic intracerebral hemorrhage: Secondary | ICD-10-CM

## 2019-07-24 DIAGNOSIS — R2689 Other abnormalities of gait and mobility: Secondary | ICD-10-CM

## 2019-07-24 DIAGNOSIS — R278 Other lack of coordination: Secondary | ICD-10-CM

## 2019-07-24 DIAGNOSIS — R262 Difficulty in walking, not elsewhere classified: Secondary | ICD-10-CM

## 2019-07-24 NOTE — Patient Instructions (Signed)
Hip Extension: 2-4 Inches    Tighten gluteal muscle. Lift one leg __15_ times. Restabilize pelvis. Repeat with other leg. Keep pelvis still. Be sure pelvis does not rotate and back does not arch. Do _2__ sets, __1_ times per day.  http://ss.exer.us/63   Copyright  VHI. All rights reserved.  Heel Raise: Bilateral (Standing)    Rise on balls of feet. Repeat _20___ times per set. Do __2__ sets per session. Do ___1_ sessions per day.  http://orth.exer.us/39   Copyright  VHI. All rights reserved.  Chair Pose    Stand with legs hip-width apart. Inhaling, bend knees trying to keep knees over ankles, as if to sit on a chair and extend arms in front, wrists slightly lower than shoulders. Hold position for __3_ breaths. Exhaling, stand up. Repeat _15__ times. Do __1_ times per day.  Copyright  VHI. All rights reserved.

## 2019-07-24 NOTE — Therapy (Signed)
[place] MAIN [facility] SERVICES [address] [place], [place], [address] Phone: [phone]   Fax:  [phone]  Occupational Therapy Treatment  Patient Details  Name: Stephanie Jones MRN: [id number] Date of Birth: 04-13-1951 No data recorded  Encounter Date: 07/24/2019  OT End of Session - 07/24/19 1120    Visit Number  6    Number of Visits  24    Date for OT Re-Evaluation  09/24/19    OT Start Time  1430    OT Stop Time  1515    OT Time Calculation (min)  45 min    Activity Tolerance  Patient tolerated treatment well    Behavior During Therapy  [facility] for tasks assessed/performed       Past Medical History:  Diagnosis Date  . Cancer ([place])    breast  . COPD (chronic obstructive pulmonary disease) ([place])   . GERD (gastroesophageal reflux disease)   . Hypertension   . Migraines     Past Surgical History:  Procedure Laterality Date  . ABDOMINAL HYSTERECTOMY      There were no vitals filed for this visit.  Subjective Assessment - 07/24/19 1119    Subjective   Pt. reports having an MD appointment tomorrow for follow-up from the shunt.    Patient is accompanied by:  Family member    Patient Stated Goals  Patient would like to be fully independent with all tasks    Currently in Pain?  No/denies      OT TREATMENT    Neuro muscular re-education:  Pt. worked on tasks to sustain lateral pinch on resistive tweezers while grasping and moving 2" toothpick sticks from a horizontal flat position to a vertical position in order to place it in the holder. Pt. was able to sustain grasp while positioning and extending the wrist/hand in the necessary alignment needed to place the stick through the top of the holder. Pt. worked on bilateral [facility] skills grasping, manipulating, tying, and untying ropes of various size widths. Pt. worked on progressing from large, medium, to small widths of rope.   Therapeutic Activities:  Pt. worked on Office manager  tasks, and was able to copy a sequence of 4/5 cards accurately, and 5/6 cards accurately. Pt. was able to identify missing cards accurately from a series of 5, and 6 cards. Pt. worked on [employer] with cards at the tabletop with cues,a nd increased time to complete.  Response to Treatment:  Pt. reports having had an MD appointment the other day. Pt. reports that she won't be able to drive for 6 weeks, and will need to have a driving test before hand. Pt. reports that she has and an MD appointment for her vision. Pt. continues to work on improving UE strength, [facility] skills, and Cognitive IADL tasks in order to improve overall IADL functioning.                     OT Education - 07/24/19 1120    Education Details  [facility] Skills    Person(s) Educated  Patient    Methods  Explanation;Demonstration    Comprehension  Verbalized understanding;Need further instruction          OT Long Term Goals - 07/05/19 0938      OT LONG TERM GOAL #1   Title  Patient will be independent with home exercise program.    Baseline  no current program at eval    Time  6  Period  Weeks    Status  New    Target Date  08/13/19      OT LONG TERM GOAL #2   Title  Patient will complete tub/shower transfer with modified independence    Baseline  supervision at eval    Time  6    Period  Weeks    Status  New    Target Date  08/13/19      OT LONG TERM GOAL #3   Title  Patient will improve grip strength by 5# to be able to cut food independently.    Baseline  family assisting with cutting food    Time  6    Period  Weeks    Status  New    Target Date  08/13/19      OT LONG TERM GOAL #4   Title  Patient will perform light homemaking tasks with modified independence.    Baseline  requires assist at eval    Time  12    Period  Weeks    Status  New    Target Date  09/24/19      OT LONG TERM GOAL #5   Title  Paient will perform light meal prep with microwave use with supervision.     Baseline  assist at eval    Time  6    Period  Weeks    Status  New    Target Date  08/13/19      Long Term Additional Goals   Additional Long Term Goals  Yes      OT LONG TERM GOAL #6   Title  Patient will demonstrate good safety awareness with meal prep using oven and stove with supervision    Baseline  decreased safety at eval    Time  12    Status  New    Target Date  09/24/19      OT LONG TERM GOAL #7   Title  Patient will demonstrate the ability to manage her medications with supervision only.    Baseline  assist with medications for what and when to take    Time  12    Period  Weeks    Status  New    Target Date  09/24/19      OT LONG TERM GOAL #8   Title  Pt will demonstrate ability to manage finances with bill paying, account reconciliation with supervision    Baseline  dependent at eval    Time  12    Period  Weeks    Target Date  09/24/19            Plan - 07/24/19 1121    Clinical Impression Statement Pt. reports having had an MD appointment the other day. Pt. reports that she won't be able to drive for 6 weeks, and will need to have a driving test before hand. Pt. reports that she has and an MD appointment for her vision. Pt. continues to work on improving UE strength, [facility] skills, and Cognitive IADL tasks in order to improve overall IADL functioning.    OT Occupational Profile and History  Detailed Assessment- Review of Records and additional review of physical, cognitive, psychosocial history related to current functional performance    Occupational performance deficits (Please refer to evaluation for details):  ADL's;IADL's;Social Participation    Body Structure / Function / Physical Skills  ADL;Coordination;UE functional use;IADL;Dexterity;FMC;ROM;Strength    Cognitive Skills  Problem Solve;Safety Awareness    Psychosocial Skills  Coping Strategies;Interpersonal Interaction;Environmental  Adaptations;Routines and Behaviors    Rehab Potential  Good     Clinical Decision Making  Several treatment options, min-mod task modification necessary    Comorbidities Affecting Occupational Performance:  May have comorbidities impacting occupational performance    Modification or Assistance to Complete Evaluation   Min-Moderate modification of tasks or assist with assess necessary to complete eval    OT Frequency  2x / week    OT Duration  12 weeks    OT Treatment/Interventions  Self-care/ADL training;Therapeutic exercise;DME and/or AE instruction;Cognitive remediation/compensation;Neuromuscular education;Manual Therapy;Psychosocial skills training;Moist Heat;Therapeutic activities;Patient/family education;Coping strategies training;Cryotherapy    Consulted and Agree with Plan of Care  Patient    Family Member Consulted  daughter       Patient will benefit from skilled therapeutic intervention in order to improve the following deficits and impairments:   Body Structure / Function / Physical Skills: ADL, Coordination, UE functional use, IADL, Dexterity, FMC, ROM, Strength Cognitive Skills: Problem Solve, Safety Awareness Psychosocial Skills: Coping Strategies, Interpersonal Interaction, Environmental  Adaptations, Routines and Behaviors   Visit Diagnosis: Muscle weakness (generalized)  Other lack of coordination    Problem List Patient Active Problem List   Diagnosis Date Noted  . Altered mental status 09/17/2018    Harrel Carina, MS, OTR/L 07/24/2019, 11:30 AM  [place] MAIN [facility] SERVICES [address] [place], [place], [address] Phone: [phone]   Fax:  [phone]  Name: Stephanie Jones MRN: [id number] Date of Birth: 1950-12-01

## 2019-07-24 NOTE — Therapy (Signed)
[place] [facility]IN [facility] SERVICES [address] [place], [place], [address] Phone: [phone]   Fax:  [phone]  Physical Therapy Treatment  Patient Details  Name: Stephanie Jones MRN: [id number] Date of Birth: July 25, 1951 Referring Provider (PT): Amy Malka So   Encounter Date: 07/24/2019  PT End of Session - 07/24/19 1050    Visit Number  5    Number of Visits  17    Date for PT Re-Evaluation  08/29/19    PT Start Time  1100    PT Stop Time  1140    PT Time Calculation (min)  40 min    Equipment Utilized During Treatment  Gait belt    Activity Tolerance  Patient tolerated treatment well;No increased pain    Behavior During Therapy  WFL for tasks assessed/performed       Past Medical History:  Diagnosis Date  . Cancer ([place])    breast  . COPD (chronic obstructive pulmonary disease) ([place])   . GERD (gastroesophageal reflux disease)   . Hypertension   . Migraines     Past Surgical History:  Procedure Laterality Date  . ABDOMINAL HYSTERECTOMY      There were no vitals filed for this visit.  Subjective Assessment - 07/24/19 1050    Subjective  Patient is doing well today, no head ache    Patient is accompained by:  Family member    Pertinent History  Pt is a 68 y.o. 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 Patient s/p right VPS placement was readmitted on 06/17/19 for non-traumatic ICH with left body involvement, discharged on 06/28/19 to home with family support. Upon discharge, patient required min assist for ADLs and mobility, motor function and strength appeared WNL, and was able to ambulate 100 ft with RW.    Limitations  Walking;Standing;Lifting;House hold activities    How long can you sit comfortably?  N/A    How long can you walk comfortably?  Very tired after trip  to [employer]    Diagnostic tests  CT Scan    Patient Stated Goals  Wants to return to driving and have a workout routine.    Currently in Pain?  No/denies    Pain Score  0-No pain    Pain Onset  More than a month ago    Pain Onset  More than a month ago       Treatment: [employer] fitness x 5 mins L 5  Neuromuscular Training: BOSU ball: staggered stance, head turns side/side, up/down x 5 reps each, each foot in front; VCs for proper technique and positioning for each exercise staggered stance, trunk rotation with 2 lb rod, VC to keep UE straight and turn head with trunk  Rockerboard Lateral weight shift x10 reps each direction, no UE support, CGA for safety with VCs to tap in each direction, controlling the speed Anterior/Posterior weight shift x10 reps, no UE support, CGA for safety with VCs to utilize ankles to shift weight over toes and back to heels Blue Foam: Side stepping x10 on blue balance CGA for safety, VCs for taking a big enough step  Airex pad trunk rotation x2 min, CGA for safety, demonstrated difficulty with keeping arms extended and full rotation with head turn Airex pad, balloon tapping to mirror x2 min, supervision for safety with varying directions and speed of balloon,  VCs for utilizing both hands and minimizing UE support BOSU: Lunge to BOSU ball x 10 , cues for going slow and to control the speed Matrix: Fwd/bwd gait with 22. 5 lbs and CGA, cues for posture and stepping strategies, occasional LOB Side stepping left and right and CGA with cues to slow movement    Pt educated throughout session about proper posture and technique with exercises. Improved exercise technique, movement at target joints, use of target muscles after min to mod verbal, visual, tactile cues. CGA and Min to mod verbal cues used throughout with increased in postural sway and LOB most seen with narrow base of support and while on uneven surfaces. Continues to have balance deficits typical with  diagnosis.                      PT Education - 07/24/19 1050    Education Details  hep    Person(s) Educated  Patient    Methods  Explanation;Demonstration;Tactile cues;Verbal cues    Comprehension  Verbalized understanding;Returned demonstration;Need further instruction       PT Short Term Goals - 07/04/19 1526      PT SHORT TERM GOAL #1   Title  Patient will be independent in initial home exercise program to improve strength/mobility for better functional independence with ADLs.    Time  4    Period  Weeks    Status  New    Target Date  08/01/19      PT SHORT TERM GOAL #2   Title  --    Baseline  --    Time  --    Period  --    Status  --    Target Date  --        PT Long Term Goals - 07/04/19 1533      PT LONG TERM GOAL #1   Title  Patient will increase 10 meter walk test to >1.0 m/s as to improve gait speed to community ambulator and improve gait ability.    Baseline  07/04/19 Gait speed is 0.85 m/s.    Time  8    Period  Weeks    Status  New    Target Date  08/29/19      PT LONG TERM GOAL #2   Title  Patient will be independent in finalized home exercise program to improve strength/mobility for better functional independence with ADLs.    Baseline  --    Time  8    Period  Weeks    Status  New    Target Date  08/29/19      PT LONG TERM GOAL #3   Title  Patient will be able to balance independently on one leg for >15 sec BLE with minimal sway.    Baseline  Patient able to balance independently on each leg for 10 sec with moderate sway.    Time  8    Period  Weeks    Status  New    Target Date  08/29/19      PT LONG TERM GOAL #4   Title  Patient will improve all LE MMT grades to 5/5 to facilitate improved functional independence with ADLs and IADLs.    Baseline  Patient has 5/5 strength in both LEs except for: 4/5 hip add BLE, 4+/5 hip ext LLE.    Time  8    Period  Weeks    Status  New    Target Date  08/29/19      PT LONG TERM  GOAL #5   Title  Pt will ambulate >126feet when performing 6 MWT to indicate normalized gait speed and community ambulator.    Baseline  perform next session    Time  8    Period  Weeks    Status  New    Target Date  08/29/19            Plan - 07/24/19 1051    Clinical Impression Statement  Pt was able to perform all exercises today with CGA.Marland Kitchen Pt was able to perform all balance and strength exercises, demonstrating improvements in LE strength and stability.  Pt was able to complete dynamic balance exercises, showing ability to stand on even surfaces with min assist and improve postural reactions to correct self during activities.  Pt requires verbal, visual and tactile cues during exercise in order to complete tasks with proper form and technique, as well as to stay on task.  Pt would continue to benefit from skilled PT services in order to further strengthen LE's, improve static and dynamic balance, and improve coordination in order to increase functional mobility and decrease risk of falls   Personal Factors and Comorbidities  Age;Comorbidity 3+;Behavior Pattern    Comorbidities  COPD, HTN, Hx of smoking, Hx of CVA, Hx of cancer (breast).    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/Moderate complexity    Rehab Potential  Good    PT Frequency  2x / week    PT Duration  8 weeks    PT Treatment/Interventions  ADLs/Self Care Home Management;Cryotherapy;Electrical Stimulation;Moist Heat;Traction;Ultrasound;Gait training;Stair training;Functional mobility training;Therapeutic activities;Therapeutic exercise;Balance training;Neuromuscular re-education;Patient/family education;Manual techniques;Passive range of motion;Dry needling;Orthotic Fit/Training    PT Next Visit Plan  quad cane    PT Home Exercise Plan  HEP  needs development    Consulted and Agree with Plan of Care  Patient       Patient will benefit from skilled therapeutic intervention in order to improve the following deficits and impairments:  Abnormal gait, Decreased balance, Decreased endurance, Difficulty walking, Impaired sensation, Decreased cognition, Cardiopulmonary status limiting activity, Decreased activity tolerance, Decreased strength, Impaired UE functional use, Pain  Visit Diagnosis: Muscle weakness (generalized)  Other lack of coordination  Cognitive communication deficit  Other abnormalities of gait and mobility  Difficulty in walking, not elsewhere classified  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 Altered mental status 09/17/2018    Alanson Puls, PT DPT 07/24/2019, 10:52 AM  [place] MAIN [facility] SERVICES [address] [place], [place], [address] Phone: [phone]   Fax:  [phone]  Name: KAETLYN MELITA MRN: [id number] Date of Birth: [birth year]

## 2019-07-29 ENCOUNTER — Ambulatory Visit: Payer: Medicare HMO | Admitting: Speech Pathology

## 2019-08-01 ENCOUNTER — Other Ambulatory Visit: Payer: Self-pay

## 2019-08-01 ENCOUNTER — Ambulatory Visit: Payer: Medicare HMO | Attending: Physical Medicine and Rehabilitation | Admitting: Physical Therapy

## 2019-08-01 ENCOUNTER — Encounter: Payer: Self-pay | Admitting: Physical Therapy

## 2019-08-01 DIAGNOSIS — R278 Other lack of coordination: Secondary | ICD-10-CM | POA: Diagnosis present

## 2019-08-01 DIAGNOSIS — R262 Difficulty in walking, not elsewhere classified: Secondary | ICD-10-CM

## 2019-08-01 DIAGNOSIS — M6281 Muscle weakness (generalized): Secondary | ICD-10-CM | POA: Insufficient documentation

## 2019-08-01 DIAGNOSIS — R2689 Other abnormalities of gait and mobility: Secondary | ICD-10-CM | POA: Insufficient documentation

## 2019-08-01 DIAGNOSIS — R41841 Cognitive communication deficit: Secondary | ICD-10-CM | POA: Diagnosis present

## 2019-08-01 NOTE — Therapy (Signed)
[place] MAIN [facility] SERVICES [address] [place], [place], [address] Phone: [phone]   Fax:  [phone]  Physical Therapy Treatment  Patient Details  Name: Stephanie Jones MRN: [id number] Date of Birth: [birth year] Referring Provider (PT): Amy Malka So   Encounter Date: 08/01/2019  PT End of Session - 08/01/19 1357    Visit Number  6    Number of Visits  17    Date for PT Re-Evaluation  08/29/19    PT Start Time  0153    PT Stop Time  0231    PT Time Calculation (min)  38 min    Equipment Utilized During Treatment  Gait belt    Activity Tolerance  Patient tolerated treatment well;No increased pain    Behavior During Therapy  WFL for tasks assessed/performed       Past Medical History:  Diagnosis Date  . Cancer ([place])    breast  . COPD (chronic obstructive pulmonary disease) ([place])   . GERD (gastroesophageal reflux disease)   . Hypertension   . Migraines     Past Surgical History:  Procedure Laterality Date  . ABDOMINAL HYSTERECTOMY      There were no vitals filed for this visit.  Subjective Assessment - 08/01/19 1356    Subjective  Patient is doing well today, no head ache    Patient is accompained by:  Family member    Pertinent History  Pt is a 68 y.o. 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 Patient s/p right VPS placement was readmitted on 06/17/19 for non-traumatic ICH with left body involvement, discharged on 06/28/19 to home with family support. Upon discharge, patient required min assist for ADLs and mobility, motor function and strength appeared WNL, and was able to ambulate 100 ft with RW.    Limitations  Walking;Standing;Lifting;House hold activities    How long can you sit comfortably?  N/A    How long can you walk comfortably?  Very tired after trip  to [employer]    Diagnostic tests  CT Scan    Patient Stated Goals  Wants to return to driving and have a workout routine.    Currently in Pain?  No/denies    Pain Score  0-No pain    Pain Onset  More than a month ago    Pain Onset  More than a month ago       Therapeutic exercise: Octane fitness x 5 mins L 4  Supine: SLR x 15 BLE Hookling marching x 15  Hooklying abd/ER x 15  Bridging x 15 SAQ x 15 BLE Hip abd/add x 15, BLE Heel slides x 15, BLE  Sidelying: Hip abd x 15 , BLE Flex/ext x 15, BLE Calm x 15 , BLE  Prone: Knee flex x 15 BLE Knee flex and hip ext x 15 , BLE Knee ext and hip ext x 15 BLE  Quadriped: LE/UE ext with 5 sec hold x 10  LE extension with 3 lbs hold x 10 BLE  Leg press 90 lbs x 20 x 2 , 45 lbs B heel raise x 20 x 3     Patient performed with instruction, verbal cues, tactile cues of therapist: goal: increase tissue extensibility, promote proper posture, improve mobility  PT Education - 08/01/19 1357    Education Details  HEP    Person(s) Educated  Patient    Methods  Explanation;Demonstration;Tactile cues;Verbal cues    Comprehension  Verbalized understanding;Returned demonstration       PT Short Term Goals - 07/04/19 1526      PT SHORT TERM GOAL #1   Title  Patient will be independent in initial home exercise program to improve strength/mobility for better functional independence with ADLs.    Time  4    Period  Weeks    Status  New    Target Date  08/01/19      PT SHORT TERM GOAL #2   Title  --    Baseline  --    Time  --    Period  --    Status  --    Target Date  --        PT Long Term Goals - 07/04/19 1533      PT LONG TERM GOAL #1   Title  Patient will increase 10 meter walk test to >1.0 m/s as to improve gait speed to community ambulator and improve gait ability.    Baseline  07/04/19 Gait speed is 0.85 m/s.    Time  8    Period  Weeks    Status  New    Target Date  08/29/19       PT LONG TERM GOAL #2   Title  Patient will be independent in finalized home exercise program to improve strength/mobility for better functional independence with ADLs.    Baseline  --    Time  8    Period  Weeks    Status  New    Target Date  08/29/19      PT LONG TERM GOAL #3   Title  Patient will be able to balance independently on one leg for >15 sec BLE with minimal sway.    Baseline  Patient able to balance independently on each leg for 10 sec with moderate sway.    Time  8    Period  Weeks    Status  New    Target Date  08/29/19      PT LONG TERM GOAL #4   Title  Patient will improve all LE MMT grades to 5/5 to facilitate improved functional independence with ADLs and IADLs.    Baseline  Patient has 5/5 strength in both LEs except for: 4/5 hip add BLE, 4+/5 hip ext LLE.    Time  8    Period  Weeks    Status  New    Target Date  08/29/19      PT LONG TERM GOAL #5   Title  Pt will ambulate >1224feet when performing 6 MWT to indicate normalized gait speed and community ambulator.    Baseline  perform next session    Time  8    Period  Weeks    Status  New    Target Date  08/29/19            Plan - 08/01/19 1358    Clinical Impression Statement  Patient instructed in intermediate balance/strengthening exercise. Patient fatigues quickly requiring short rest breaks in between intermediate exercise. Patient instructed in advanced strengthening with increased repetition/resistance. Patient requires CGA for intermediate balance exercise especially with less rail assist. Patient would benefit from additional skilled PT intervention to improve balance/gait safety and reduce fall risk.   Personal Factors and Comorbidities  Age;Comorbidity 3+;Behavior Pattern    Comorbidities  COPD, HTN, Hx of smoking, Hx of CVA, Hx of cancer (breast).    Examination-Activity Limitations  Carry;Locomotion Level;Stairs;Stand    Examination-Participation Restrictions  Cleaning;Community  Activity;Driving;Laundry;Shop;Yard Work    [employer] officer  Evolving/Moderate complexity    Rehab Potential  Good    PT Frequency  2x / week    PT Duration  8 weeks    PT Treatment/Interventions  ADLs/Self Care Home Management;Cryotherapy;Electrical Stimulation;Moist Heat;Traction;Ultrasound;Gait training;Stair training;Functional mobility training;Therapeutic activities;Therapeutic exercise;Balance training;Neuromuscular re-education;Patient/family education;Manual techniques;Passive range of motion;Dry needling;Orthotic Fit/Training    PT Next Visit Plan  quad cane    PT Home Exercise Plan  HEP needs development    Consulted and Agree with Plan of Care  Patient       Patient will benefit from skilled therapeutic intervention in order to improve the following deficits and impairments:  Abnormal gait, Decreased balance, Decreased endurance, Difficulty walking, Impaired sensation, Decreased cognition, Cardiopulmonary status limiting activity, Decreased activity tolerance, Decreased strength, Impaired UE functional use, Pain  Visit Diagnosis: Muscle weakness (generalized)  Other lack of coordination  Other abnormalities of gait and mobility  Difficulty in walking, not elsewhere classified     Problem List Patient Active Problem List   Diagnosis Date Noted  . Altered mental status 09/17/2018    Alanson Puls, PT DPT 08/01/2019, 1:58 PM  [place] MAIN [facility] SERVICES [address] [place], [place], [address] Phone: [phone]   Fax:  [phone]  Name: Stephanie Jones MRN: [id number] Date of Birth: 18-Sep-1951

## 2019-08-06 ENCOUNTER — Encounter: Payer: Self-pay | Admitting: Speech Pathology

## 2019-08-06 ENCOUNTER — Other Ambulatory Visit: Payer: Self-pay

## 2019-08-06 ENCOUNTER — Ambulatory Visit: Payer: Medicare HMO | Admitting: Physical Therapy

## 2019-08-06 ENCOUNTER — Ambulatory Visit: Payer: Medicare HMO | Admitting: Speech Pathology

## 2019-08-06 ENCOUNTER — Encounter: Payer: Self-pay | Admitting: Physical Therapy

## 2019-08-06 DIAGNOSIS — M6281 Muscle weakness (generalized): Secondary | ICD-10-CM

## 2019-08-06 DIAGNOSIS — R41841 Cognitive communication deficit: Secondary | ICD-10-CM

## 2019-08-06 DIAGNOSIS — R262 Difficulty in walking, not elsewhere classified: Secondary | ICD-10-CM

## 2019-08-06 DIAGNOSIS — R278 Other lack of coordination: Secondary | ICD-10-CM

## 2019-08-06 DIAGNOSIS — R2689 Other abnormalities of gait and mobility: Secondary | ICD-10-CM
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
## 2019-08-06 NOTE — Therapy (Signed)
[place] MAIN [facility] SERVICES [address] [place], [place], [address] Phone: [phone]   Fax:  [phone]  Physical Therapy Treatment  Patient Details  Name: Stephanie Jones MRN: [id number] Date of Birth: 1950-10-24 Referring Provider (PT): Amy Malka So   Encounter Date: 08/06/2019  PT End of Session - 08/06/19 1024    Visit Number  7    Number of Visits  17    Date for PT Re-Evaluation  08/29/19    PT Start Time  1020    PT Stop Time  1100    PT Time Calculation (min)  40 min    Equipment Utilized During Treatment  Gait belt    Activity Tolerance  Patient tolerated treatment well;No increased pain    Behavior During Therapy  WFL for tasks assessed/performed       Past Medical History:  Diagnosis Date  . Cancer ([place])    breast  . COPD (chronic obstructive pulmonary disease) ([place])   . GERD (gastroesophageal reflux disease)   . Hypertension   . Migraines     Past Surgical History:  Procedure Laterality Date  . ABDOMINAL HYSTERECTOMY      There were no vitals filed for this visit.  Subjective Assessment - 08/06/19 1024    Subjective  Patient is doing well today,no pain , no new concerns.    Patient is accompained by:  Family member    Pertinent History  Pt is a 68 y.o. 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 Patient s/p right VPS placement was readmitted on 06/17/19 for non-traumatic ICH with left body involvement, discharged on 06/28/19 to home with family support. Upon discharge, patient required min assist for ADLs and mobility, motor function and strength appeared WNL, and was able to ambulate 100 ft with RW.    Limitations  Walking;Standing;Lifting;House hold activities    How long can you sit comfortably?  N/A    How long can you walk comfortably?  Very  tired after trip to [employer]    Diagnostic tests  CT Scan    Patient Stated Goals  Wants to return to driving and have a workout routine.    Currently in Pain?  No/denies    Pain Score  0-No pain    Pain Onset  More than a month ago    Pain Onset  More than a month ago       Treatment: Octane fitness x 5 mins UE and LE level 6 Supine:4# BLE SLR x 15 BLE Hookling marching x 15  Hooklying abd/ER x 15  Bridging x 15 SAQ x 15 BLE Hip abd/add x 15, BLE Sidelying: Hip abd x 15 , BLE, hold for 3 sec Prone: Knee flex x 15 BLE Knee flex and hip ext x 15 , BLE Knee ext and hip ext x 15 BLE Quadriped: LE/UE ext with 5 sec hold x 10  LE extension with 3 lbs hold x 10 BLE  Blue Foam: Side stepping x10 on blue balance CGA for safety, VCs for taking a big enough step  Airex pad trunk rotation x2 min, CGA for safety, demonstrated difficulty with keeping arms extended and full rotation with head turn  Floor Star exercise: Performed stepping on the star diagram on the floor. Working on weight-shifting forward onto the foot that stepped forward and then weight-shifting back  and bringing her feet back together with CGA. Performed 2 reps each foot.   Gait out in hallway, CGA with VCs for maintaining gait speed and step length Horizontal head turns x100 ft, naming cards while walking  Vertical head turns x100 ft Direction changes x100 ft, min difficulty with quick changes Speed changes x100 ft, min difficulty with slower gait speed and maintaining balance, min difficulty with increased in gait speed    Pt educated throughout session about proper posture and technique with exercises. Improved exercise technique, movement at target joints, use of target muscles after min to mod verbal, visual, tactile cues. CGA and Min to mod verbal cues used throughout with increased in postural sway and LOB most seen with narrow base of support and while on uneven surfaces. Continues to have balance deficits  typical with diagnosis. Patient performs intermediate level exercises without pain behaviors and needs verbal cuing for postural alignment                        PT Education - 08/06/19 1024    Education Details  HEP    Person(s) Educated  Patient    Methods  Explanation    Comprehension  Verbalized understanding;Returned demonstration;Need further instruction       PT Short Term Goals - 07/04/19 1526      PT SHORT TERM GOAL #1   Title  Patient will be independent in initial home exercise program to improve strength/mobility for better functional independence with ADLs.    Time  4    Period  Weeks    Status  New    Target Date  08/01/19      PT SHORT TERM GOAL #2   Title  --    Baseline  --    Time  --    Period  --    Status  --    Target Date  --        PT Long Term Goals - 07/04/19 1533      PT LONG TERM GOAL #1   Title  Patient will increase 10 meter walk test to >1.0 m/s as to improve gait speed to community ambulator and improve gait ability.    Baseline  07/04/19 Gait speed is 0.85 m/s.    Time  8    Period  Weeks    Status  New    Target Date  08/29/19      PT LONG TERM GOAL #2   Title  Patient will be independent in finalized home exercise program to improve strength/mobility for better functional independence with ADLs.    Baseline  --    Time  8    Period  Weeks    Status  New    Target Date  08/29/19      PT LONG TERM GOAL #3   Title  Patient will be able to balance independently on one leg for >15 sec BLE with minimal sway.    Baseline  Patient able to balance independently on each leg for 10 sec with moderate sway.    Time  8    Period  Weeks    Status  New    Target Date  08/29/19      PT LONG TERM GOAL #4   Title  Patient will improve all LE MMT grades to 5/5 to facilitate improved functional independence with ADLs and IADLs.    Baseline  Patient has 5/5 strength in both LEs  except for: 4/5 hip add BLE, 4+/5 hip ext LLE.     Time  8    Period  Weeks    Status  New    Target Date  08/29/19      PT LONG TERM GOAL #5   Title  Pt will ambulate >127feet when performing 6 MWT to indicate normalized gait speed and community ambulator.    Baseline  perform next session    Time  8    Period  Weeks    Status  New    Target Date  08/29/19            Plan - 08/06/19 1025    Clinical Impression Statement  Patient instructed in beginning balance and coordination exercise. Patient required mod VCs and min A for gait to improve weight shift and postural control. Patient requires min VCs to improve ankle stability with gait.  Patients would benefit from additional skilled PT intervention to improve balance/gait safety and reduce fall risk.    Personal Factors and Comorbidities  Age;Comorbidity 3+;Behavior Pattern    Comorbidities  COPD, HTN, Hx of smoking, Hx of CVA, Hx of cancer (breast).    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/Moderate complexity    Rehab Potential  Good    PT Frequency  2x / week    PT Duration  8 weeks    PT Treatment/Interventions  ADLs/Self Care Home Management;Cryotherapy;Electrical Stimulation;Moist Heat;Traction;Ultrasound;Gait training;Stair training;Functional mobility training;Therapeutic activities;Therapeutic exercise;Balance training;Neuromuscular re-education;Patient/family education;Manual techniques;Passive range of motion;Dry needling;Orthotic Fit/Training    PT Next Visit Plan  quad cane    PT Home Exercise Plan  HEP needs development    Consulted and Agree with Plan of Care  Patient       Patient will benefit from skilled therapeutic intervention in order to improve the following deficits and impairments:  Abnormal gait, Decreased balance, Decreased endurance, Difficulty walking, Impaired  sensation, Decreased cognition, Cardiopulmonary status limiting activity, Decreased activity tolerance, Decreased strength, Impaired UE functional use, Pain  Visit Diagnosis: Muscle weakness (generalized)  Other lack of coordination  Other abnormalities of gait and mobility  Difficulty in walking, not elsewhere classified     Problem List Patient Active Problem List   Diagnosis Date Noted  . Altered mental status 09/17/2018    Alanson Puls , PT DPT 08/06/2019, 10:26 AM  [place] MAIN [facility] SERVICES [address] [place], [place], [address] Phone: [phone]   Fax:  [phone]  Name: KETTI MEMORY MRN: [id number] Date of Birth: 10-04-1950

## 2019-08-08 ENCOUNTER — Other Ambulatory Visit: Payer: Self-pay

## 2019-08-08 ENCOUNTER — Encounter: Payer: Self-pay | Admitting: Speech Pathology

## 2019-08-08 ENCOUNTER — Ambulatory Visit: Payer: Medicare HMO | Admitting: Speech Pathology

## 2019-08-08 ENCOUNTER — Ambulatory Visit: Payer: Medicare HMO

## 2019-08-08 ENCOUNTER — Ambulatory Visit: Payer: Medicare HMO | Admitting: Occupational Therapy

## 2019-08-08 DIAGNOSIS — R278 Other lack of coordination: Secondary | ICD-10-CM

## 2019-08-08 DIAGNOSIS — R2689 Other abnormalities of gait and mobility: Secondary | ICD-10-CM

## 2019-08-08 DIAGNOSIS — M6281 Muscle weakness (generalized): Secondary | ICD-10-CM | POA: Diagnosis not present

## 2019-08-08 DIAGNOSIS — R41841 Cognitive communication deficit: Secondary | ICD-10-CM

## 2019-08-08 NOTE — Therapy (Signed)
[place] MAIN [facility] SERVICES [address] [place], [place], [address] Phone: [phone]   Fax:  [phone]  Occupational Therapy Treatment  Patient Details  Name: Stephanie Jones MRN: [id number] Date of Birth: September 02, 1951 No data recorded  Encounter Date: 08/08/2019  OT End of Session - 08/11/19 1723    Visit Number  7    Number of Visits  24    Date for OT Re-Evaluation  09/24/19    OT Start Time  1015    OT Stop Time  1059    OT Time Calculation (min)  44 min    Activity Tolerance  Patient tolerated treatment well    Behavior During Therapy  [facility] for tasks assessed/performed       Past Medical History:  Diagnosis Date  . Cancer ([place])    breast  . COPD (chronic obstructive pulmonary disease) ([place])   . GERD (gastroesophageal reflux disease)   . Hypertension   . Migraines     Past Surgical History:  Procedure Laterality Date  . ABDOMINAL HYSTERECTOMY      There were no vitals filed for this visit.  Subjective Assessment - 08/11/19 1722    Subjective   Patient reports she was able to take a shower now by herself without difficulty.    Patient Stated Goals  Patient would like to be fully independent with all tasks    Currently in Pain?  No/denies    Pain Score  0-No pain        Reassessment of grip, pinch and coordination see flow sheet above for details.  [facility] OT Assessment - 08/11/19 1719      Coordination   Right 9 Hole Peg Test  24    Left 9 Hole Peg Test  28      Hand Function   Right Hand Grip (lbs)  65    Right Hand Lateral Pinch  15 lbs    Right Hand 3 Point Pinch  17 lbs    Left Hand Grip (lbs)  45    Left Hand Lateral Pinch  15 lbs    Left 3 point pinch  15 lbs     Patient seen for manipulation of [place] discs, pick up, flipping with occasional cues for isolated finger movements with right hand then with bilateral UE to promote simultaneous movements.  Multiple trials completed working on speed and  dexterity.    Patient is now able to complete bed making,  cleaning bedroom, vacuuming, laundry at home.    She still has Difficulty with the following tasks: Opening containers, jars, balance while performing tasks, reports she still forgets things more, tripping on items she doesn't see.  Therapeutic Exercises:  Patient seen for finger strengthening tasks with use of Bulletin board with moderate resistance to place and remove pins.  Cues to push pins all the way into the board.  Rest breaks as needed.    Response to tx: Patient has continued to make good progress in all areas.  She has improved strength bilaterally with grip and pinch skills and has demonstrated improvements with fine motor coordination and speed with 9 hole peg test.  She is now able to complete her basic self care tasks with modified independence and is working on more IADL tasks at home.  She still has not returned to cooking, money management and still requires some supervision and cues with medication management.  OT Education - 08/11/19 1722    Education Details  fine motor coordination skils.    Person(s) Educated  Patient    Methods  Explanation;Demonstration    Comprehension  Verbalized understanding;Need further instruction          OT Long Term Goals - 08/11/19 1724      OT LONG TERM GOAL #1   Title  Patient will be independent with home exercise program.    Baseline  no current program at eval    Time  6    Period  Weeks    Status  On-going      OT LONG TERM GOAL #2   Title  Patient will complete tub/shower transfer with modified independence    Baseline  supervision at eval    Time  6    Period  Weeks    Status  Achieved      OT LONG TERM GOAL #3   Title  Patient will improve grip strength by 5# to be able to cut food independently.    Baseline  family assisting with cutting food    Time  6    Period  Weeks    Status  Achieved      OT LONG TERM GOAL #4    Title  Patient will perform light homemaking tasks with modified independence.    Baseline  requires assist at eval    Time  12    Period  Weeks    Status  Achieved      OT LONG TERM GOAL #5   Title  Paient will perform light meal prep with microwave use with supervision.    Baseline  assist at eval    Time  6    Period  Weeks    Status  Achieved      OT LONG TERM GOAL #6   Title  Patient will demonstrate good safety awareness with meal prep using oven and stove with supervision    Baseline  decreased safety at eval    Time  12    Status  On-going      OT LONG TERM GOAL #7   Title  Patient will demonstrate the ability to manage her medications with supervision only.    Baseline  assist with medications for what and when to take    Time  12    Period  Weeks    Status  On-going      OT LONG TERM GOAL #8   Title  Pt will demonstrate ability to manage finances with bill paying, account reconciliation with supervision    Baseline  dependent at eval    Time  12    Period  Weeks            Plan - 08/11/19 1723    Clinical Impression Statement  Patient has continued to make good progress in all areas.  She has improved strength bilaterally with grip and pinch skills and has demonstrated improvements with fine motor coordination and speed with 9 hole peg test.  She is now able to complete her basic self care tasks with modified independence and is working on more IADL tasks at home.  She still has not returned to cooking, money management and still requires some supervision and cues with medication management.    OT Occupational Profile and History  Detailed Assessment- Review of Records and additional review of physical, cognitive, psychosocial history related to current functional performance    Occupational performance deficits (  Please refer to evaluation for details):  ADL's;IADL's;Social Participation    Body Structure / Function / Physical Skills  ADL;Coordination;UE  functional use;IADL;Dexterity;FMC;ROM;Strength    Cognitive Skills  Problem Solve;Safety Awareness    Psychosocial Skills  Coping Strategies;Interpersonal Interaction;Environmental  Adaptations;Routines and Behaviors    Rehab Potential  Good    Clinical Decision Making  Several treatment options, min-mod task modification necessary    Comorbidities Affecting Occupational Performance:  May have comorbidities impacting occupational performance    Modification or Assistance to Complete Evaluation   Min-Moderate modification of tasks or assist with assess necessary to complete eval    OT Frequency  2x / week    OT Duration  12 weeks    OT Treatment/Interventions  Self-care/ADL training;Therapeutic exercise;DME and/or AE instruction;Cognitive remediation/compensation;Neuromuscular education;Manual Therapy;Psychosocial skills training;Moist Heat;Therapeutic activities;Patient/family education;Coping strategies training;Cryotherapy    Consulted and Agree with Plan of Care  Patient       Patient will benefit from skilled therapeutic intervention in order to improve the following deficits and impairments:   Body Structure / Function / Physical Skills: ADL, Coordination, UE functional use, IADL, Dexterity, FMC, ROM, Strength Cognitive Skills: Problem Solve, Safety Awareness Psychosocial Skills: Coping Strategies, Interpersonal Interaction, Environmental  Adaptations, Routines and Behaviors   Visit Diagnosis: Muscle weakness (generalized)  Other lack of coordination    Problem List Patient Active Problem List   Diagnosis Date Noted  . Altered mental status 09/17/2018   Achilles Dunk, OTR/L, CLT  Lovett,Amy 08/11/2019, 5:25 PM  [place] MAIN [facility] SERVICES [address] [place], [place], [address] Phone: [phone]   Fax:  [phone]  Name: ALSACE QUIRINDONGO MRN: [id number] Date of Birth: 26-Jul-1951
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
## 2019-08-08 NOTE — Therapy (Signed)
[place] MAIN [facility] SERVICES [address] [place], [place], [address] Phone: [phone]   Fax:  [phone]  Physical Therapy Treatment  Patient Details  Name: Stephanie Jones MRN: [id number] Date of Birth: 1951/08/02 Referring Provider (PT): Amy Malka So   Encounter Date: 08/08/2019  PT End of Session - 08/08/19 0932    Visit Number  8    Number of Visits  17    Date for PT Re-Evaluation  08/29/19    PT Start Time  0929    PT Stop Time  1013    PT Time Calculation (min)  44 min    Equipment Utilized During Treatment  Gait belt    Activity Tolerance  Patient tolerated treatment well;No increased pain    Behavior During Therapy  WFL for tasks assessed/performed       Past Medical History:  Diagnosis Date  . Cancer ([place])    breast  . COPD (chronic obstructive pulmonary disease) ([place])   . GERD (gastroesophageal reflux disease)   . Hypertension   . Migraines     Past Surgical History:  Procedure Laterality Date  . ABDOMINAL HYSTERECTOMY      There were no vitals filed for this visit.  Subjective Assessment - 08/08/19 0931    Subjective  Patient reports doing well, no falls or LOB. No falls or LOB since last session.    Patient is accompained by:  Family member    Pertinent History  Pt is a 69 y.o. 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 Patient s/p right VPS placement was readmitted on 06/17/19 for non-traumatic ICH with left body involvement, discharged on 06/28/19 to home with family support. Upon discharge, patient required min assist for ADLs and mobility, motor function and strength appeared WNL, and was able to ambulate 100 ft with RW.    Limitations  Walking;Standing;Lifting;House hold activities    How long can you sit comfortably?  N/A    How long can you  walk comfortably?  Very tired after trip to [employer]    Diagnostic tests  CT Scan    Patient Stated Goals  Wants to return to driving and have a workout routine.    Currently in Pain?  No/denies          Treatment: [employer] x 5 mins UE and LE level 6  Goals: SLS:  15 seconds each LE after multiple trials MMT: grossly 4+/5 10 MWT= 1.67 m/s 6 MWT: 1495 ft   Gait out in hallway, CGA with VCs for maintaining gait speed and step length Horizontal head turns x160 ft, naming cards while walking  Vertical head turns x160 ft Speed changes x160 ft, min difficulty with slower gait speed and maintaining balance, min difficulty with increased in gait speed  Ball Toss vertically 160 ft with CGA Ball bounce 160 ft  Resisted walking Metrix machine #12.5 lb. 3x each direction, L, R, backwards, forwards   Pt educated throughout session about proper posture and technique with exercises. Improved exercise technique, movement at target joints, use of target muscles after min to mod verbal, visual, tactile cues.                      PT Education - 08/08/19 0931    Education Details  exercise technique, body mechanics    Person(s)  Educated  Patient    Methods  Explanation;Demonstration;Tactile cues;Verbal cues    Comprehension  Verbalized understanding;Returned demonstration;Tactile cues required;Verbal cues required       PT Short Term Goals - 07/04/19 1526      PT SHORT TERM GOAL #1   Title  Patient will be independent in initial home exercise program to improve strength/mobility for better functional independence with ADLs.    Time  4    Period  Weeks    Status  New    Target Date  08/01/19      PT SHORT TERM GOAL #2   Title  --    Baseline  --    Time  --    Period  --    Status  --    Target Date  --        PT Long Term Goals - 08/08/19 0959      PT LONG TERM GOAL #1   Title  Patient will increase 10 meter walk test to >1.0 m/s as to improve gait  speed to community ambulator and improve gait ability.    Baseline  07/04/19 Gait speed is 0.85 m/s. 11/12: 1.67 m/s    Time  8    Period  Weeks    Status  Achieved      PT LONG TERM GOAL #2   Title  Patient will be independent in finalized home exercise program to improve strength/mobility for better functional independence with ADLs.    Baseline  compliant with HEP    Time  8    Period  Weeks    Status  Partially Met    Target Date  08/29/19      PT LONG TERM GOAL #3   Title  Patient will be able to balance independently on one leg for >15 sec BLE with minimal sway.    Baseline  Patient able to balance independently on each leg for 10 sec with moderate sway. 11/12: 15 seconds with moderate sway    Time  8    Period  Weeks    Status  Partially Met    Target Date  08/29/19      PT LONG TERM GOAL #4   Title  Patient will improve all LE MMT grades to 5/5 to facilitate improved functional independence with ADLs and IADLs.    Baseline  Patient has 5/5 strength in both LEs except for: 4/5 hip add BLE, 4+/5 hip ext LLE. 11/12: 4+/5    Time  8    Period  Weeks    Status  Partially Met    Target Date  08/29/19      PT LONG TERM GOAL #5   Title  Pt will ambulate >1252fet when performing 6 MWT to indicate normalized gait speed and community ambulator.    Baseline  perform next session 11/12: 1495 ft    Time  8    Period  Weeks    Status  Achieved            Plan - 08/08/19 1222    Clinical Impression Statement  Patient reports she is doing better in regards to physical therapy, is open to the idea of reducing frequency of PT sessions due to need for OT and ST. Will decrease frequency to 1x/week for trial period. Patients would benefit from additional skilled PT intervention to improve balance/gait safety and reduce fall risk.    Personal Factors and Comorbidities  Age;Comorbidity 3+;Behavior Pattern  Comorbidities  COPD, HTN, Hx of smoking, Hx of CVA, Hx of cancer (breast).     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/Moderate complexity    Rehab Potential  Good    PT Frequency  2x / week    PT Duration  8 weeks    PT Treatment/Interventions  ADLs/Self Care Home Management;Cryotherapy;Electrical Stimulation;Moist Heat;Traction;Ultrasound;Gait training;Stair training;Functional mobility training;Therapeutic activities;Therapeutic exercise;Balance training;Neuromuscular re-education;Patient/family education;Manual techniques;Passive range of motion;Dry needling;Orthotic Fit/Training    PT Next Visit Plan  quad cane    PT Home Exercise Plan  HEP needs development    Consulted and Agree with Plan of Care  Patient       Patient will benefit from skilled therapeutic intervention in order to improve the following deficits and impairments:  Abnormal gait, Decreased balance, Decreased endurance, Difficulty walking, Impaired sensation, Decreased cognition, Cardiopulmonary status limiting activity, Decreased activity tolerance, Decreased strength, Impaired UE functional use, Pain  Visit Diagnosis: Muscle weakness (generalized)  Other lack of coordination  Other abnormalities of gait and mobility     Problem List Patient Active Problem List   Diagnosis Date Noted  . Altered mental status 09/17/2018   Janna Arch, PT, DPT    08/08/2019, 12:24 PM  [place] MAIN [facility] SERVICES [address] [place], [place], [address] Phone: [phone]   Fax:  [phone]  Name: Stephanie Jones MRN: [id number] Date of Birth: [birth year]

## 2019-08-12 ENCOUNTER — Ambulatory Visit: Payer: Medicare HMO

## 2019-08-14 ENCOUNTER — Ambulatory Visit: Payer: Medicare HMO | Admitting: Speech Pathology

## 2019-08-14 ENCOUNTER — Ambulatory Visit: Payer: Medicare HMO

## 2019-08-21 ENCOUNTER — Ambulatory Visit: Payer: Medicare HMO | Admitting: Physical Therapy

## 2019-08-21 ENCOUNTER — Ambulatory Visit: Payer: Medicare HMO | Admitting: Speech Pathology

## 2019-08-21 ENCOUNTER — Ambulatory Visit: Payer: Medicare HMO

## 2019-08-26 ENCOUNTER — Ambulatory Visit: Payer: Medicare HMO | Admitting: Occupational Therapy

## 2019-08-26 ENCOUNTER — Ambulatory Visit: Payer: Medicare HMO | Admitting: Speech Pathology

## 2019-08-26 ENCOUNTER — Ambulatory Visit: Payer: Medicare HMO

## 2019-08-28 ENCOUNTER — Encounter: Payer: Medicare HMO | Admitting: Speech Pathology

## 2019-08-28 ENCOUNTER — Ambulatory Visit: Payer: Medicare HMO | Admitting: Physical Therapy

## 2019-09-02 ENCOUNTER — Other Ambulatory Visit: Payer: Self-pay

## 2019-09-02 ENCOUNTER — Ambulatory Visit: Payer: Medicare HMO | Admitting: Physical Therapy

## 2019-09-02 ENCOUNTER — Ambulatory Visit: Payer: Medicare HMO | Admitting: Occupational Therapy

## 2019-09-02 ENCOUNTER — Ambulatory Visit: Payer: Medicare HMO | Attending: Physical Medicine and Rehabilitation | Admitting: Speech Pathology

## 2019-09-02 ENCOUNTER — Encounter: Payer: Self-pay | Admitting: Occupational Therapy

## 2019-09-02 DIAGNOSIS — R41841 Cognitive communication deficit: Secondary | ICD-10-CM

## 2019-09-02 DIAGNOSIS — R278 Other lack of coordination: Secondary | ICD-10-CM | POA: Diagnosis present

## 2019-09-02 DIAGNOSIS — M6281 Muscle weakness (generalized): Secondary | ICD-10-CM | POA: Diagnosis present

## 2019-09-02 NOTE — Therapy (Signed)
[place] MAIN [facility] SERVICES [address] [place], [place], [address] Phone: [phone]   Fax:  [phone]  Occupational Therapy Treatment  Patient Details  Name: Stephanie Jones MRN: [id number] Date of Birth: 1951/08/17 No data recorded  Encounter Date: 09/02/2019  OT End of Session - 09/04/19 [id number]    Number of Visits  24    Date for OT Re-Evaluation  09/24/19    OT Start Time  1500    OT Stop Time  1545    OT Time Calculation (min)  45 min    Activity Tolerance  Patient tolerated treatment well    Behavior During Therapy  [facility] for tasks assessed/performed       Past Medical History:  Diagnosis Date  . Cancer ([place])    breast  . COPD (chronic obstructive pulmonary disease) ([place])   . GERD (gastroesophageal reflux disease)   . Hypertension   . Migraines     Past Surgical History:  Procedure Laterality Date  . ABDOMINAL HYSTERECTOMY      There were no vitals filed for this visit.  Subjective Assessment - 09/04/19 0921    Subjective   Patient reports her next doctor's appt is Dec 15.  Patient would really like to be able to drive again and wants to ask her doctor about this at her appointment.    Patient Stated Goals  Patient would like to be fully independent with all tasks    Currently in Pain?  No/denies    Pain Score  0-No pain      Therapeutic Exercise: Patient seen for UB strengthening with use of UBE, forwards/backwards, alternating levels of resistance from 3.8 to 4.2 with therapist in constant attendance to ensure grip and to adjust settings.   Patient performing strengthening exercises with use of 4# weights dumbells for alternating shoulder flexion, chest press, ABD, elbow flexion/extension, triceps press, 2 sets of 15 reps each, therapist demo and cues for pace.    Instructed on theraband exercises for home program for shoulder ABD, diagonal patterns and elbow flexion/ext for 2 sets of 15 reps each.  See code for  medbridge issued to patient with written /pictorial exercises.  Cues for proper form and technique.  Patient seen this date for Check writing and money managements skills.  Patient able to write out 2 checks correctly without cues or assistance.  Assist to record and manage check register, patient reports she doesn't use a register at home but will check her balance by calling the bank and then subtracting any bills she pays.      Medbridge Access Code [email or web address]     Response to tx: Patient continues to make good progress, able to demonstrate increased ability to focus and attend to tasks.  Prior to her hospitalization she was active and worked out with weights several times a week and would like to get back to using weights and working on strengthening tasks.  She did have minimal difficulty with use of the calculator when attempting to balance checkbook and required cues to complete.  Continue to work towards goals in plan of care to increase independence in daily tasks.                 OT Education - 09/04/19 [id number]    Education Details  HEP with weights.    Person(s) Educated  Patient    Methods  Explanation;Demonstration    Comprehension  Verbalized understanding;Need further instruction  OT Long Term Goals - 08/11/19 1724      OT LONG TERM GOAL #1   Title  Patient will be independent with home exercise program.    Baseline  no current program at eval    Time  6    Period  Weeks    Status  On-going      OT LONG TERM GOAL #2   Title  Patient will complete tub/shower transfer with modified independence    Baseline  supervision at eval    Time  6    Period  Weeks    Status  Achieved      OT LONG TERM GOAL #3   Title  Patient will improve grip strength by 5# to be able to cut food independently.    Baseline  family assisting with cutting food    Time  6    Period  Weeks    Status  Achieved      OT LONG TERM GOAL #4   Title  Patient will perform light  homemaking tasks with modified independence.    Baseline  requires assist at eval    Time  12    Period  Weeks    Status  Achieved      OT LONG TERM GOAL #5   Title  Paient will perform light meal prep with microwave use with supervision.    Baseline  assist at eval    Time  6    Period  Weeks    Status  Achieved      OT LONG TERM GOAL #6   Title  Patient will demonstrate good safety awareness with meal prep using oven and stove with supervision    Baseline  decreased safety at eval    Time  12    Status  On-going      OT LONG TERM GOAL #7   Title  Patient will demonstrate the ability to manage her medications with supervision only.    Baseline  assist with medications for what and when to take    Time  12    Period  Weeks    Status  On-going      OT LONG TERM GOAL #8   Title  Pt will demonstrate ability to manage finances with bill paying, account reconciliation with supervision    Baseline  dependent at eval    Time  12    Period  Weeks            Plan - 09/04/19 [id number]    Clinical Impression Statement  Patient continues to make good progress, able to demonstrate increased ability to focus and attend to tasks.  Prior to her hospitalization she was active and worked out with weights several times a week and would like to get back to using weights and working on strengthening tasks.  She did have minimal difficulty with use of the calculator when attempting to balance checkbook and required cues to complete.  Continue to work towards goals in plan of care to increase independence in daily tasks.    OT Occupational Profile and History  Detailed Assessment- Review of Records and additional review of physical, cognitive, psychosocial history related to current functional performance    Occupational performance deficits (Please refer to evaluation for details):  ADL's;IADL's;Social Participation    Body Structure / Function / Physical Skills  ADL;Coordination;UE functional  use;IADL;Dexterity;FMC;ROM;Strength    Cognitive Skills  Problem Solve;Safety Awareness    Psychosocial Skills  Coping Strategies;Interpersonal  Interaction;Environmental  Adaptations;Routines and Behaviors    Rehab Potential  Good    Clinical Decision Making  Several treatment options, min-mod task modification necessary    Comorbidities Affecting Occupational Performance:  May have comorbidities impacting occupational performance    Modification or Assistance to Complete Evaluation   Min-Moderate modification of tasks or assist with assess necessary to complete eval    OT Frequency  2x / week    OT Duration  12 weeks    OT Treatment/Interventions  Self-care/ADL training;Therapeutic exercise;DME and/or AE instruction;Cognitive remediation/compensation;Neuromuscular education;Manual Therapy;Psychosocial skills training;Moist Heat;Therapeutic activities;Patient/family education;Coping strategies training;Cryotherapy    Consulted and Agree with Plan of Care  Patient       Patient will benefit from skilled therapeutic intervention in order to improve the following deficits and impairments:   Body Structure / Function / Physical Skills: ADL, Coordination, UE functional use, IADL, Dexterity, FMC, ROM, Strength Cognitive Skills: Problem Solve, Safety Awareness Psychosocial Skills: Coping Strategies, Interpersonal Interaction, Environmental  Adaptations, Routines and Behaviors   Visit Diagnosis: Muscle weakness (generalized)  Other lack of coordination  Cognitive communication deficit    Problem List Patient Active Problem List   Diagnosis Date Noted  . Altered mental status 09/17/2018   Achilles Dunk, OTR/L, CLT  Stephanie Jones 09/04/2019, 10:52 AM  [place] MAIN [facility] SERVICES [address] [place], [place], [address] Phone: [phone]   Fax:  [phone]  Name: Stephanie Jones MRN: [id number] Date of Birth: 06-11-1951

## 2019-09-03 ENCOUNTER — Encounter: Payer: Self-pay | Admitting: Speech Pathology

## 2019-09-03 NOTE — Therapy (Signed)
[place] MAIN [facility] SERVICES [address] [place], [place], [address] Phone: [phone]   Fax:  [phone]  Speech Language Pathology Treatment  Patient Details  Name: Stephanie Jones MRN: [id number] Date of Birth: 12-01-1950 Referring Provider (SLP): Dr. Dina Rich   Encounter Date: 09/02/2019  End of Session - 09/03/19 1418    Visit Number  4    Number of Visits  17    Date for SLP Re-Evaluation  09/13/19    Authorization Type  Medicare    Authorization Time Period  Start 07/19/2019    Authorization - Visit Number  4    Authorization - Number of Visits  10    SLP Start Time  1400    SLP Stop Time   1455    SLP Time Calculation (min)  55 min    Activity Tolerance  Patient tolerated treatment well       Past Medical History:  Diagnosis Date  . Cancer ([place])    breast  . COPD (chronic obstructive pulmonary disease) ([place])   . GERD (gastroesophageal reflux disease)   . Hypertension   . Migraines     Past Surgical History:  Procedure Laterality Date  . ABDOMINAL HYSTERECTOMY      There were no vitals filed for this visit.  Subjective Assessment - 09/03/19 1417    Subjective  The patient acknowledges impact of deperession oncognitive function            ADULT SLP TREATMENT - 09/03/19 0001      General Information   Behavior/Cognition  Alert;Cooperative;Pleasant mood    HPI  Pt is a 68 y.o. 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 Patient s/p right VPS placement was readmitted on 06/17/19 for non-traumatic ICH with left body involvement, discharged on 06/28/19 to home with family support.       Treatment Provided   Treatment provided  Cognitive-Linquistic      Pain Assessment   Pain Assessment  No/denies pain      Cognitive-Linquistic Treatment   Treatment focused on  Cognition    Skilled Treatment  IDENTIFY COGNITIVE BARRIERS: Patient acknowledged her functional deficits and reported satisfaction with her current level of reliance on family members for support with these activities.  EXECUTIVE FUNCTION: Name items given attributes with 80% accuracy given encouragement to "think outside the box".  Identify item that does not belong with the others with 80% accuracy and state reason with 80% accuracy.  Patient required mod cues to complete a categorization grid (general category/subcategory/specific member) demonstrating some difficulty grasping and maintaining the concept general category vs. subcategory.        Assessment / Recommendations / Plan   Plan  Continue with current plan of care      Progression Toward Goals   Progression toward goals  Progressing toward goals       SLP Education - 09/03/19 1417    Education Details  Be open to learning and feel free to ask questions    Person(s) Educated  Patient    Methods  Explanation    Comprehension  Verbalized understanding         SLP Long Term Goals - 07/19/19 1217      SLP LONG TERM GOAL #1   Title  Patient will identify cognitive-communication barriers and participate in developing functional compensatory  strategies.    Time  8    Period  Weeks    Status  New    Target Date  09/13/19      SLP LONG TERM GOAL #2   Title  Patient will demonstrate functional cognitive-communication skills for independent completion of personal responsibilities and leisure activities.    Time  8    Period  Weeks    Status  New    Target Date  09/13/19      SLP LONG TERM GOAL #3   Title  Patient will complete complex executive function skills tasks with 80% accuracy.    Time  8    Period  Weeks    Status  New    Target Date  09/13/19      SLP LONG TERM GOAL #4   Title  Patient will complete complex visual-spatial activities with 80% accuracy.    Time  8    Period  Weeks    Status   New    Target Date  09/13/19       Plan - 09/03/19 1419    Clinical Impression Statement  Patient presents with mild cognitive-communication impairment characterized by reduced awareness of deficits, as well as impairments across the cognitive realms of: attention, memory, executive function, language, and visuospatial skills. She expresses desire to increase her functional independence and requires cueing for safety awareness. Patient is open to learning and attempting compensatory strategy use for enhancing her cognitive-communication abilities. She is responsive to corrective feedback and verbal cueing for improving accuracy with complex tasks. Will continue with skilled ST for restorative and compensatory treatment of cognitive-communication skills.    Speech Therapy Frequency  2x / week    Duration  Other (comment)    Treatment/Interventions  Cognitive reorganization;SLP instruction and feedback;Patient/family education;Compensatory strategies;Cueing hierarchy;Internal/external aids       Patient will benefit from skilled therapeutic intervention in order to improve the following deficits and impairments:   Cognitive communication deficit    Problem List Patient Active Problem List   Diagnosis Date Noted  . Altered mental status 09/17/2018   Leroy Sea, MS/CCC- SLP  Lou Miner 09/03/2019, 2:20 PM  [place] MAIN [facility] SERVICES [address] [place], [place], [address] Phone: [phone]   Fax:  [phone]   Name: Stephanie Jones MRN: [id number] Date of Birth: August 23, 1951

## 2019-09-04 ENCOUNTER — Ambulatory Visit: Payer: Medicare HMO | Admitting: Physical Therapy

## 2019-09-04 ENCOUNTER — Ambulatory Visit: Payer: Medicare HMO | Admitting: Occupational Therapy

## 2019-09-04 ENCOUNTER — Ambulatory Visit: Payer: Medicare HMO | Admitting: Speech Pathology

## 2019-09-09 ENCOUNTER — Ambulatory Visit: Payer: Medicare HMO | Admitting: Occupational Therapy

## 2019-09-09 ENCOUNTER — Ambulatory Visit: Payer: Medicare HMO | Admitting: Speech Pathology

## 2019-09-09 ENCOUNTER — Ambulatory Visit: Payer: Medicare HMO | Admitting: Physical Therapy

## 2019-09-11 ENCOUNTER — Ambulatory Visit: Payer: Medicare HMO | Admitting: Physical Therapy

## 2019-09-11 ENCOUNTER — Ambulatory Visit: Payer: Medicare HMO | Admitting: Occupational Therapy

## 2019-09-11 ENCOUNTER — Encounter: Payer: Medicare HMO | Admitting: Speech Pathology

## 2019-09-16 ENCOUNTER — Encounter: Payer: Medicare HMO | Admitting: Speech Pathology

## 2019-09-16 ENCOUNTER — Ambulatory Visit: Payer: Medicare HMO | Admitting: Physical Therapy

## 2019-09-18 ENCOUNTER — Ambulatory Visit: Payer: Medicare HMO | Admitting: Physical Therapy

## 2019-09-18 ENCOUNTER — Encounter: Payer: Medicare HMO | Admitting: Occupational Therapy

## 2019-09-18 ENCOUNTER — Encounter: Payer: Medicare HMO | Admitting: Speech Pathology

## 2019-09-23 ENCOUNTER — Encounter: Payer: Medicare HMO | Admitting: Speech Pathology

## 2019-09-23 ENCOUNTER — Ambulatory Visit: Payer: Medicare HMO | Admitting: Physical Therapy

## 2019-09-23 ENCOUNTER — Encounter: Payer: Medicare HMO | Admitting: Occupational Therapy

## 2019-09-25 ENCOUNTER — Encounter: Payer: Medicare HMO | Admitting: Speech Pathology

## 2019-09-25 ENCOUNTER — Encounter: Payer: Medicare HMO | Admitting: Occupational Therapy

## 2019-09-25 ENCOUNTER — Ambulatory Visit: Payer: Medicare HMO | Admitting: Physical Therapy

## 2019-09-30 ENCOUNTER — Ambulatory Visit: Payer: Medicare HMO

## 2019-09-30 ENCOUNTER — Encounter: Payer: Medicare HMO | Admitting: Speech Pathology

## 2019-09-30 ENCOUNTER — Encounter: Payer: Medicare HMO | Admitting: Occupational Therapy

## 2019-10-02 ENCOUNTER — Encounter: Payer: Medicare HMO | Admitting: Speech Pathology

## 2019-10-02 ENCOUNTER — Encounter: Payer: Medicare HMO | Admitting: Occupational Therapy

## 2019-10-02 ENCOUNTER — Ambulatory Visit: Payer: Medicare HMO | Admitting: Physical Therapy

## 2019-10-07 ENCOUNTER — Ambulatory Visit: Payer: Medicare HMO

## 2019-10-07 ENCOUNTER — Encounter: Payer: Medicare HMO | Admitting: Occupational Therapy

## 2019-10-07 ENCOUNTER — Encounter: Payer: Medicare HMO | Admitting: Speech Pathology

## 2019-10-09 ENCOUNTER — Encounter: Payer: Medicare HMO | Admitting: Speech Pathology

## 2019-10-09 ENCOUNTER — Ambulatory Visit: Payer: Medicare HMO | Admitting: Physical Therapy

## 2019-10-09 ENCOUNTER — Encounter: Payer: Medicare HMO | Admitting: Occupational Therapy

## 2019-10-14 ENCOUNTER — Encounter: Payer: Medicare HMO | Admitting: Occupational Therapy

## 2019-10-14 ENCOUNTER — Ambulatory Visit: Payer: Medicare HMO

## 2019-10-14 ENCOUNTER — Encounter: Payer: Medicare HMO | Admitting: Speech Pathology

## 2019-10-16 ENCOUNTER — Ambulatory Visit: Payer: Medicare HMO | Admitting: Physical Therapy

## 2019-10-16 ENCOUNTER — Encounter: Payer: Medicare HMO | Admitting: Speech Pathology

## 2019-10-16 ENCOUNTER — Encounter: Payer: Medicare HMO | Admitting: Occupational Therapy

## 2019-10-21 ENCOUNTER — Ambulatory Visit: Payer: Medicare HMO

## 2019-10-21 ENCOUNTER — Encounter: Payer: Medicare HMO | Admitting: Speech Pathology

## 2019-10-21 ENCOUNTER — Encounter: Payer: Medicare HMO | Admitting: Occupational Therapy

## 2019-10-23 ENCOUNTER — Ambulatory Visit: Payer: Medicare HMO | Admitting: Physical Therapy

## 2019-10-23 ENCOUNTER — Encounter: Payer: Medicare HMO | Admitting: Speech Pathology

## 2019-10-23 ENCOUNTER — Encounter: Payer: Medicare HMO | Admitting: Occupational Therapy

## 2020-01-26 IMAGING — CT CT HEAD W/O CM
3 series · 14 of 33 positions shown, 17 images · non-contrast
Comparison: CT HEAD January 05, 2018

CLINICAL DATA: Found down this morning. Disoriented. Focal neuro
deficit, suspect stroke. History of breast cancer, hypertension and
migraine.

EXAM:
CT HEAD WITHOUT CONTRAST
TECHNIQUE: Contiguous axial images were obtained from the base of the skull
through the vertex without intravenous contrast.

[Series 4: head wo · axial · 0.44mm/px · z∈[-157,-42]mm · 6 of 31 slices shown, 8 images]
[im 5/31  soft-tissue]
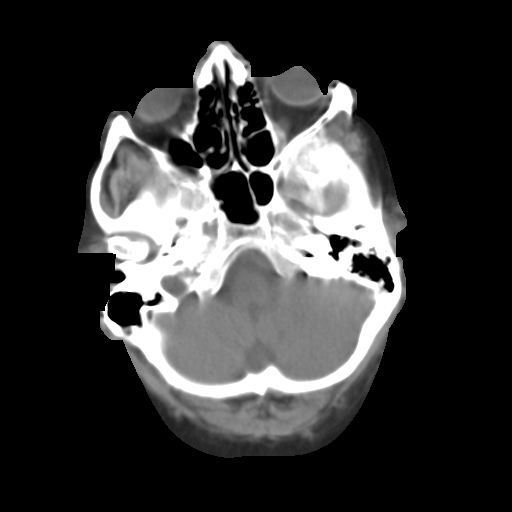
[im 5/31  bone]
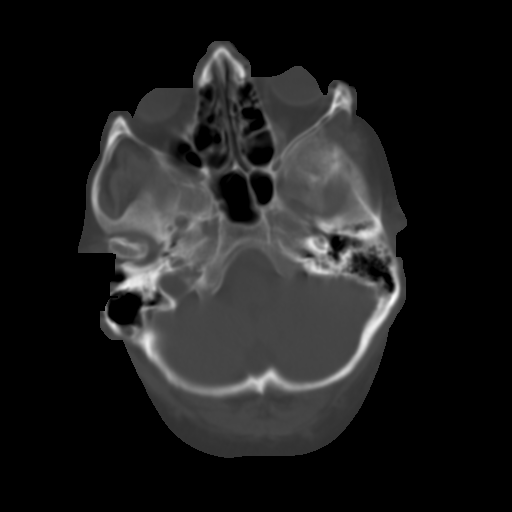
[im 10/31  bone]
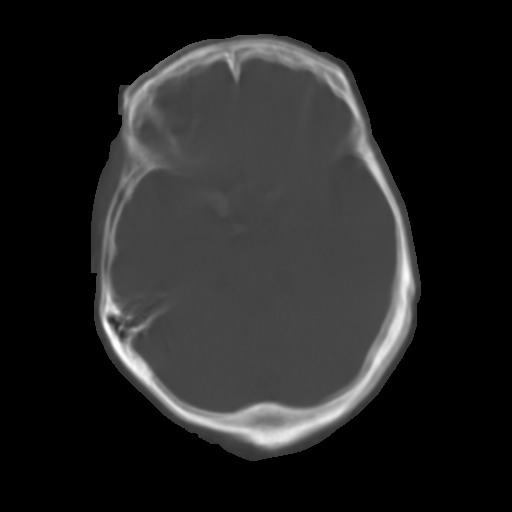
[im 14/31  bone]
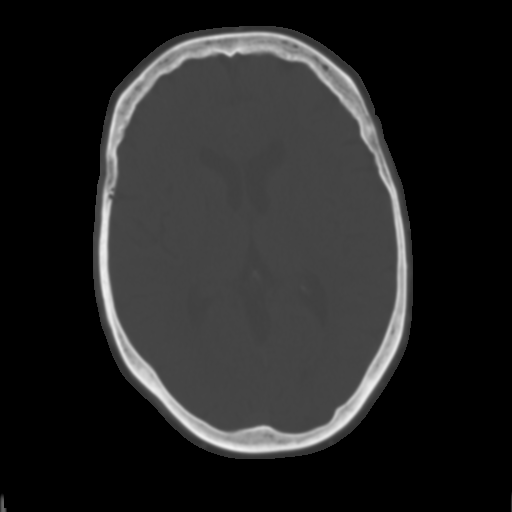
[im 19/31  bone]
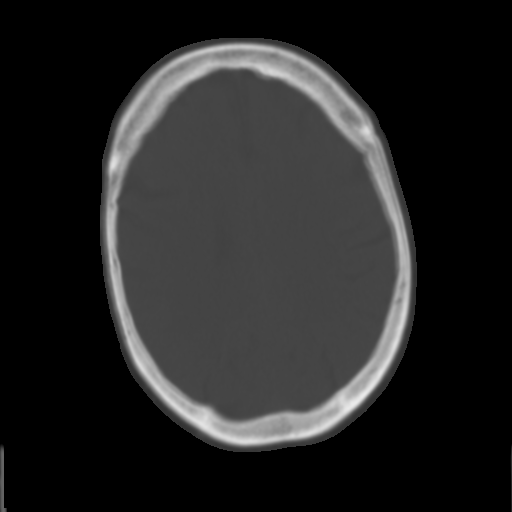
[im 24/31  soft-tissue]
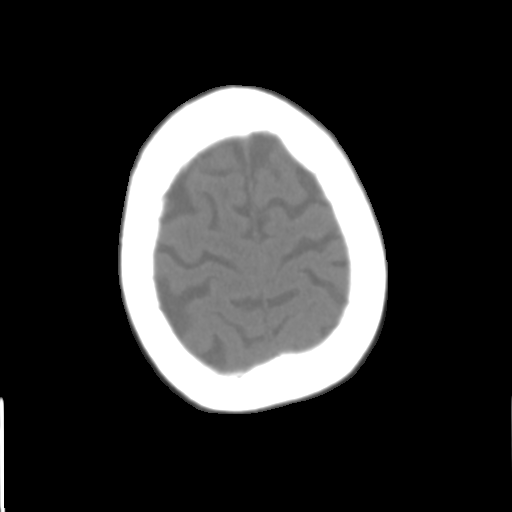
[im 24/31  bone]
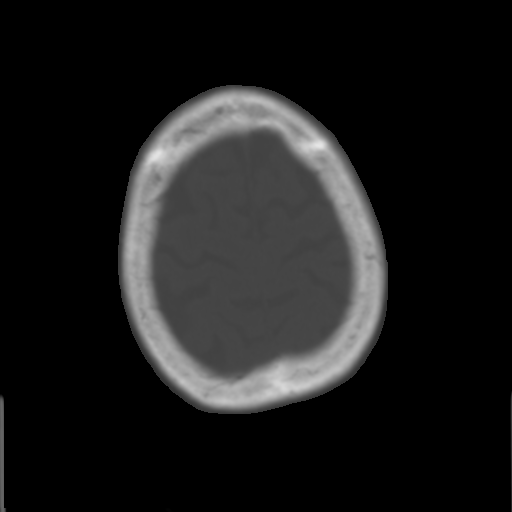
[im 28/31  bone]
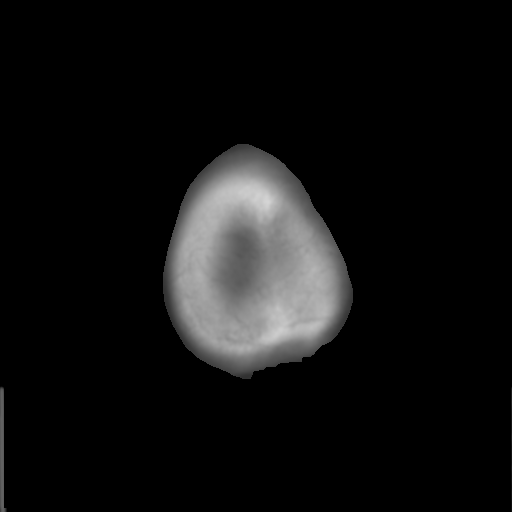

[Series 5: coronal soft tissue · coronal · 0.29mm/px · 3 of 64 slices shown]
[im 13/64  bone]
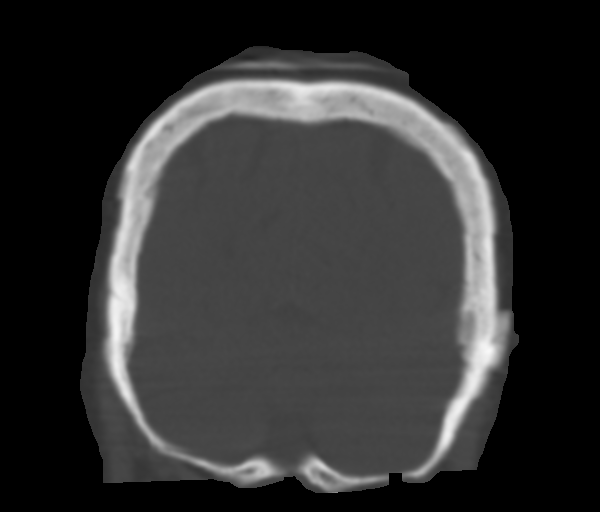
[im 26/64  bone]
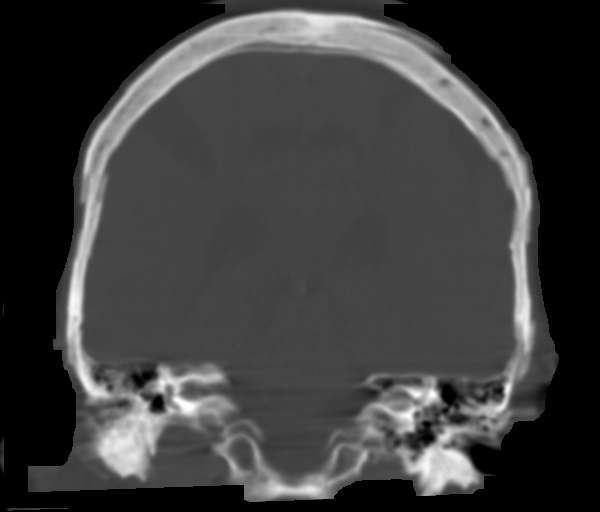
[im 38/64  bone]
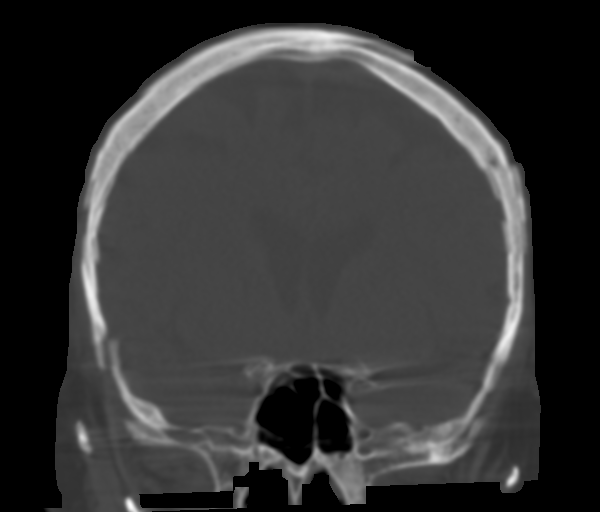

[Series 6: sagittal soft tissue · sagittal · 0.29mm/px · 5 of 60 slices shown, 6 images]
[im 20/60  bone]
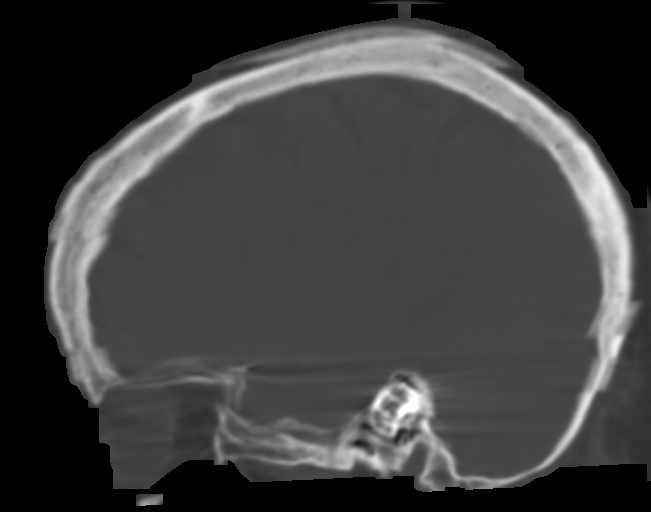
[im 25/60  bone]
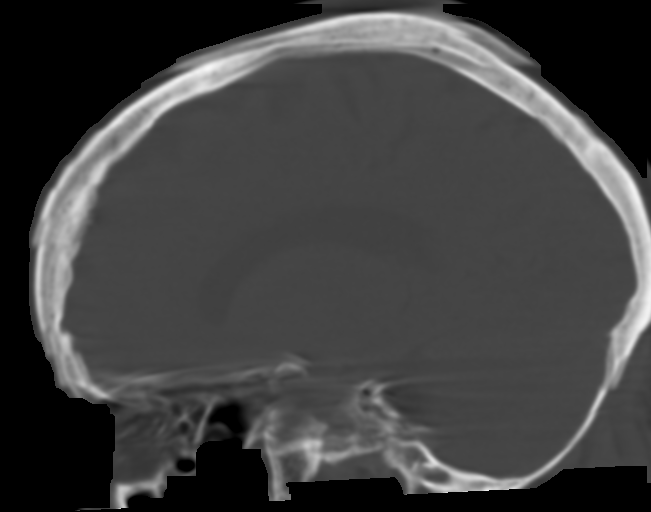
[im 30/60  soft-tissue]
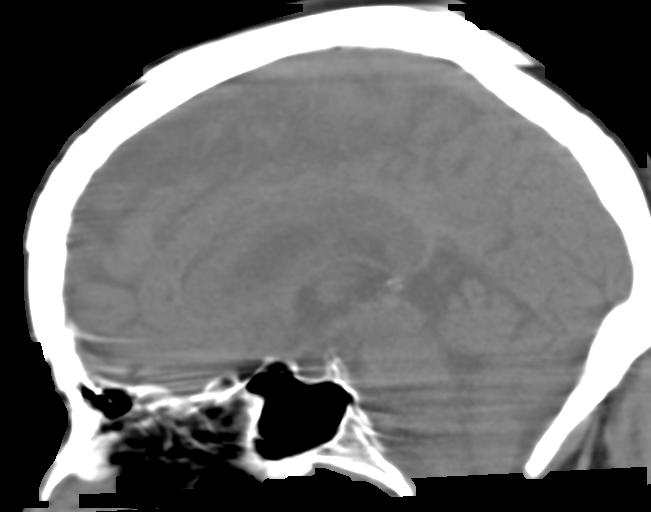
[im 30/60  bone]
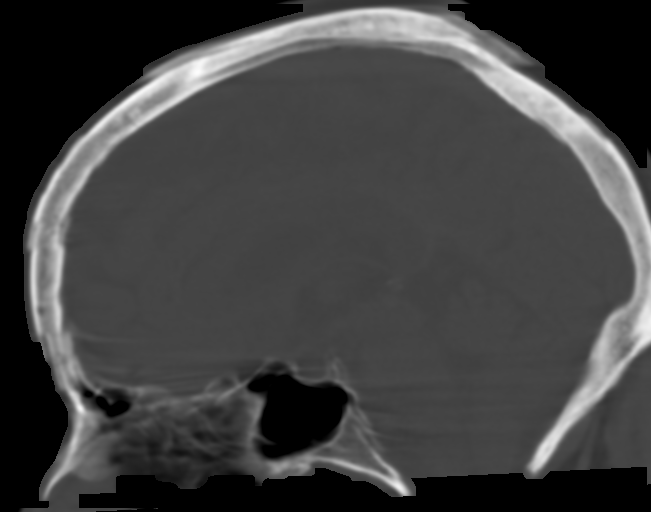
[im 35/60  bone]
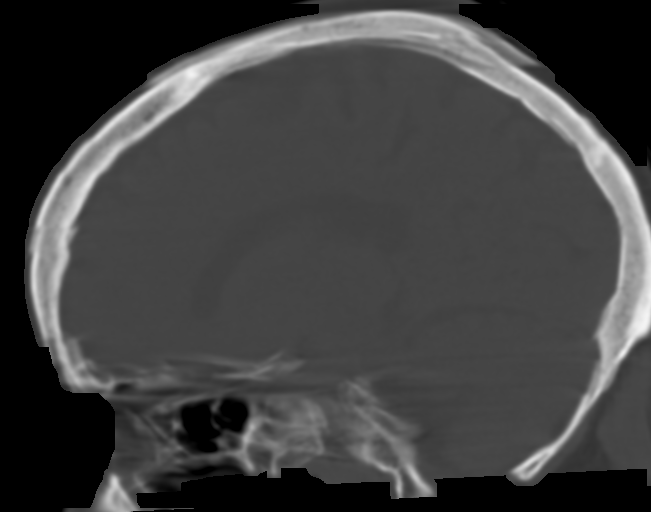
[im 40/60  bone]
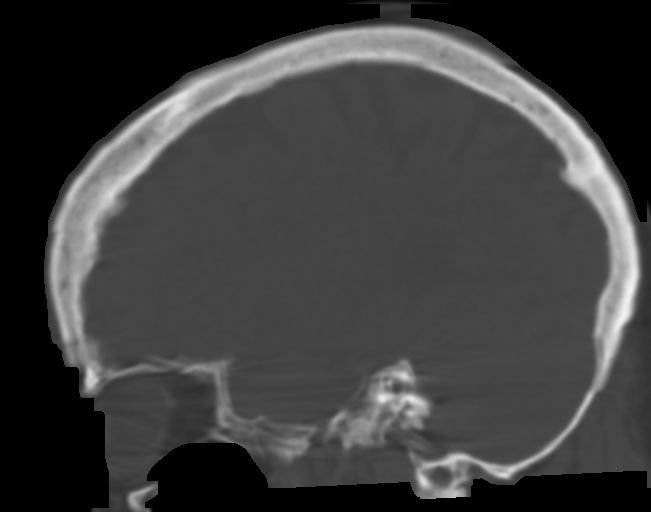

[14 of 33 positions shown; findings below may reference images not displayed]

FINDINGS: Moderately motion degraded examination.

BRAIN: No intraparenchymal hemorrhage, mass effect nor midline
shift. Borderline parenchymal brain volume loss for age. No
hydrocephalus. No acute large vascular territory infarcts. No
abnormal extra-axial fluid collections. Basal cisterns are patent.

VASCULAR: Unremarkable.

SKULL/SOFT TISSUES: No skull fracture. No significant soft tissue
swelling.

ORBITS/SINUSES: The included ocular globes and orbital contents are
normal.Trace paranasal sinus mucosal thickening. Mastoid air cells
are well aerated.

OTHER: None.
IMPRESSION: 1. Moderately motion degraded examination. No acute intracranial
process.
2. Borderline parenchymal brain volume loss for age.
3. Acute findings discussed with and reconfirmed by Dr.NENAD

## 2020-07-29 ENCOUNTER — Ambulatory Visit
Admission: EM | Admit: 2020-07-29 | Discharge: 2020-07-29 | Disposition: A | Discharge status: Home / Self Care | Payer: Medicare HMO | Attending: Family Medicine | Admitting: Family Medicine

## 2020-07-29 ENCOUNTER — Other Ambulatory Visit: Payer: Self-pay

## 2020-07-29 ENCOUNTER — Encounter: Payer: Self-pay | Admitting: Emergency Medicine

## 2020-07-29 DIAGNOSIS — B370 Candidal stomatitis: Secondary | ICD-10-CM | POA: Diagnosis not present

## 2020-07-29 HISTORY — DX: Cerebral infarction, unspecified: I63.9

## 2020-07-29 MED ORDER — FLUCONAZOLE 200 MG PO TABS: 200.0000 mg | ORAL_TABLET | Freq: Every day | ORAL | 0 refills | Status: AC

## 2020-07-29 MED ORDER — NYSTATIN 100000 UNIT/ML MT SUSP: 500000.0000 [IU] | Freq: Four times a day (QID) | OROMUCOSAL | 0 refills | Status: AC

## 2020-07-29 NOTE — ED Provider Notes (Signed)
[facility]    CSN: [id number] Arrival date & time: 07/29/20  1136      History   Chief Complaint Chief Complaint  Patient presents with  . Mouth Lesions   HPI  69 year old female presents with the above complaints.  Patient states that over the past week she has had "mouth sores".  Seems to be worse around the gumline of her dentures.  She states that her mouth burns.  She is having pain particular when she eats.  Its affecting her tongue as well.  She states that she uses inhalers regularly and thinks that this is the culprit.  She states that her dentist was unable to see her.  They referred her here.  No other associated symptoms.  No other complaints.  Past Medical History:  Diagnosis Date  . Cancer ([place])    breast  . COPD (chronic obstructive pulmonary disease) ([place])   . GERD (gastroesophageal reflux disease)   . Hypertension   . Migraines   . Stroke (cerebrum) [facility])     Patient Active Problem List   Diagnosis Date Noted  . Altered mental status 09/17/2018    Past Surgical History:  Procedure Laterality Date  . ABDOMINAL HYSTERECTOMY      OB History   No obstetric history on file.      Home Medications    Prior to Admission medications   Medication Sig Start Date End Date Taking? Authorizing Provider  albuterol (PROVENTIL HFA) 108 (90 Base) MCG/ACT inhaler Inhale 2 puffs into the lungs every 4 (four) hours as needed for wheezing or shortness of breath.   Yes [provider]  amitriptyline (ELAVIL) 25 MG tablet TAKE 1 TABLET BY MOUTH EVERY DAY AT NIGHT 04/27/20  Yes [provider]  baclofen (LIORESAL) 10 MG tablet Take 20 mg by mouth 3 (three) times daily.    Yes [provider]  busPIRone (BUSPAR) 5 MG tablet Take 15 mg by mouth 3 (three) times daily.   Yes [provider]  chlorthalidone (HYGROTON) 25 MG tablet Take 25 mg by mouth daily.   Yes [provider]  clobetasol cream (TEMOVATE) 1.24 %  Apply 1 application topically 2 (two) times daily as needed. Apply to hands and feet twice daily as needed   Yes [provider]  clonazePAM (KLONOPIN) 1 MG tablet Take 1 mg by mouth 3 (three) times daily.   Yes [provider]  fluticasone (FLONASE) 50 MCG/ACT nasal spray Place 1 spray into both nostrils daily.   Yes [provider]  fluticasone furoate-vilanterol (BREO ELLIPTA) 100-25 MCG/INH AEPB Inhale 1 puff into the lungs daily.   Yes [provider]  gabapentin (NEURONTIN) 100 MG capsule Take 100 mg by mouth 3 (three) times daily as needed.   Yes [provider]  ipratropium (ATROVENT HFA) 17 MCG/ACT inhaler Inhale 2 puffs into the lungs every 4 (four) hours as needed for wheezing.   Yes [provider]  lamoTRIgine (LAMICTAL) 100 MG tablet Take 300 mg by mouth every evening.    Yes [provider]  lisinopril (PRINIVIL,ZESTRIL) 20 MG tablet Take 20 mg by mouth daily.   Yes [provider]  loratadine (CLARITIN) 10 MG tablet Take 10 mg by mouth daily.   Yes [provider]  metoprolol tartrate (LOPRESSOR) 25 MG tablet Take by mouth. 04/08/20 04/08/21 Yes [provider]  omeprazole (PRILOSEC) 20 MG capsule Take 20 mg by mouth daily.   Yes [provider]  pravastatin (PRAVACHOL) 40 MG tablet Take 40 mg by mouth daily.    Yes [provider]  ranitidine (ZANTAC) 150 MG tablet Take 150 mg by mouth 2 (two) times daily.   Yes [provider]  SUMAtriptan (IMITREX) 20 MG/ACT nasal spray Place 20 mg into the nose every 2 (two) hours as needed for migraine or headache. May repeat in 2 hours if headache persists or recurs.   Yes [provider]  tiotropium (SPIRIVA) 18 MCG inhalation capsule Place 18 mcg into inhaler and inhale daily.   Yes [provider]  traZODone (DESYREL) 50 MG tablet Take 50 mg by mouth at bedtime.   Yes [provider]  tretinoin  (RETIN-A) 0.025 % gel Apply topically at bedtime.   Yes [provider]  varenicline (CHANTIX) 1 MG tablet Take 1 mg by mouth 2 (two) times daily.   Yes [provider]  fluconazole (DIFLUCAN) 200 MG tablet Take 1 tablet (200 mg total) by mouth daily for 14 days. 07/29/20 08/12/20  Coral Spikes, DO  nystatin (MYCOSTATIN) 100000 UNIT/ML suspension Take 5 mLs (500,000 Units total) by mouth 4 (four) times daily. Swish in the mouth and retain for as long as possible before swallowing. 07/29/20   Coral Spikes, DO  promethazine (PHENERGAN) 25 MG tablet Take 25 mg by mouth every 6 (six) hours as needed for nausea or vomiting.    [provider]   Social History Social History   Tobacco Use  . Smoking status: Current Every Day Smoker  . Smokeless tobacco: Never Used  Substance Use Topics  . Alcohol use: No  . Drug use: No     Allergies   Patient has no known allergies.   Review of Systems Review of Systems  HENT: Positive for mouth sores.    Physical Exam Triage Vital Signs ED Triage Vitals  Enc Vitals Group     BP 07/29/20 1146 136/74     Pulse Rate 07/29/20 1146 70     Resp 07/29/20 1146 18     Temp 07/29/20 1146 98.4 F (36.9 C)     Temp Source 07/29/20 1146 Oral     SpO2 07/29/20 1146 100 %     Weight 07/29/20 1143 150 lb (68 kg)     Height 07/29/20 1143 5\' 5"  (1.651 m)     Head Circumference --      Peak Flow --      Pain Score 07/29/20 1143 8     Pain Loc --      Pain Edu? --      Excl. in [place]? --    Updated Vital Signs BP 136/74 (BP Location: Left Arm)   Pulse 70   Temp 98.4 F (36.9 C) (Oral)   Resp 18   Ht 5\' 5"  (1.651 m)   Wt 68 kg   SpO2 100%   BMI 24.96 kg/m   Visual Acuity Right Eye Distance:   Left Eye Distance:   Bilateral Distance:    Right Eye Near:   Left Eye Near:    Bilateral Near:     Physical Exam Vitals and nursing note reviewed.  Constitutional:      General: She is not in acute distress.    Appearance:  Normal appearance. She is not ill-appearing.  HENT:     Head: Normocephalic and atraumatic.     Mouth/Throat:     Comments: Erythema. White/yellow plaques noted around the oropharynx and the gum line (upper).  Eyes:     General:        Right eye: No discharge.        Left eye: No discharge.     Conjunctiva/sclera: Conjunctivae normal.  Cardiovascular:     Rate and Rhythm: Normal rate and regular rhythm.  Pulmonary:     Effort: Pulmonary effort is normal.     Breath sounds: Normal breath sounds. No wheezing, rhonchi or rales.  Neurological:     Mental Status: She is alert.  Psychiatric:        Mood and Affect: Mood normal.        Behavior: Behavior normal.    UC Treatments / Results  Labs (all labs ordered are listed, but only abnormal results are displayed) Labs Reviewed - No data to display  EKG   Radiology No results found.  Procedures Procedures (including critical care time)  Medications Ordered in UC Medications - No data to display  Initial Impression / Assessment and Plan / UC Course  I have reviewed the triage vital signs and the nursing notes.  Pertinent labs & imaging results that were available during my care of the patient were reviewed by me and considered in my medical decision making (see chart for details).    69 year old female presents with oropharyngeal candidiasis. Treating with nystatin swish and swallow and Diflucan.  Final Clinical Impressions(s) / UC Diagnoses   Final diagnoses:  Oropharyngeal candidiasis   Discharge Instructions   None    ED Prescriptions    Medication Sig Dispense Auth. Provider   nystatin (MYCOSTATIN) 100000 UNIT/ML suspension Take 5 mLs (500,000 Units total) by mouth 4 (four) times daily. Swish in the mouth and retain for as long as possible before swallowing. 140 mL Angelina Neece G, DO   fluconazole (DIFLUCAN) 200 MG tablet Take 1 tablet (200 mg total) by mouth daily for 14 days. 14 tablet Coral Spikes, DO      PDMP not reviewed this encounter.   Coral Spikes, [place] 07/29/20 1431

## 2020-07-29 NOTE — ED Triage Notes (Signed)
Patient c/o mouth sores in her mouth that started 1 week ago.

## 2021-10-02 ENCOUNTER — Other Ambulatory Visit: Payer: Self-pay

## 2021-10-02 ENCOUNTER — Encounter: Payer: Self-pay | Admitting: Emergency Medicine

## 2021-10-02 ENCOUNTER — Ambulatory Visit (INDEPENDENT_AMBULATORY_CARE_PROVIDER_SITE_OTHER): Payer: Medicare HMO

## 2021-10-02 ENCOUNTER — Ambulatory Visit
Admission: EM | Admit: 2021-10-02 | Discharge: 2021-10-02 | Disposition: A | Discharge status: Home / Self Care | Payer: Medicare HMO | Attending: Physician Assistant | Admitting: Physician Assistant

## 2021-10-02 DIAGNOSIS — J449 Chronic obstructive pulmonary disease, unspecified: Secondary | ICD-10-CM

## 2021-10-02 DIAGNOSIS — F17200 Nicotine dependence, unspecified, uncomplicated: Secondary | ICD-10-CM

## 2021-10-02 DIAGNOSIS — R059 Cough, unspecified: Secondary | ICD-10-CM

## 2021-10-02 NOTE — ED Provider Notes (Signed)
[facility]    CSN: [id number] Arrival date & time: 10/02/21  1443      History   Chief Complaint Chief Complaint  Patient presents with   Cough   Nasal Congestion    HPI RIOT BARRICK is a 71 y.o. female.   71 year old female, Kiera Hewiet, presents to urgent care with chief complaint of cough and nasal congestion x3 weeks.  Boyfriend and has similar signs and symptoms, patient denies any fevers,drinking plenty of fluids,voiding well.  Patient smokes daily, has inhaler she takes PCP is Maryruth Hancock, patient states she has called and left message requesting antibiotic and has not heard back from her PCP.  Daughter is with patient in urgent care.  Patient has an essential tremor daughter states this is normal  The history is provided by the patient. No language interpreter was used.   Past Medical History:  Diagnosis Date   Cancer [facility])    breast   COPD (chronic obstructive pulmonary disease) (HCC)    GERD (gastroesophageal reflux disease)    Hypertension    Migraines    Stroke (cerebrum) [facility])     Patient Active Problem List   Diagnosis Date Noted   COPD without exacerbation ([place]) 10/02/2021   Smoker 10/02/2021   Altered mental status 09/17/2018    Past Surgical History:  Procedure Laterality Date   ABDOMINAL HYSTERECTOMY     BRAIN SURGERY      OB History   No obstetric history on file.      Home Medications    Prior to Admission medications   Medication Sig Start Date End Date Taking? Authorizing Provider  clonazePAM (KLONOPIN) 1 MG tablet Take 1 mg by mouth 3 (three) times daily.   Yes [provider]  fluticasone (FLONASE) 50 MCG/ACT nasal spray Place 1 spray into both nostrils daily.   Yes [provider]  fluticasone furoate-vilanterol (BREO ELLIPTA) 100-25 MCG/INH AEPB Inhale 1 puff into the lungs daily.   Yes [provider]  gabapentin (NEURONTIN) 100 MG capsule Take 100 mg by mouth 3 (three) times daily as  needed.   Yes [provider]  lisinopril (PRINIVIL,ZESTRIL) 20 MG tablet Take 20 mg by mouth daily.   Yes [provider]  metoprolol tartrate (LOPRESSOR) 25 MG tablet Take by mouth. 04/08/20 10/02/21 Yes [provider]  pravastatin (PRAVACHOL) 40 MG tablet Take 40 mg by mouth daily.    Yes [provider]  tiotropium (SPIRIVA) 18 MCG inhalation capsule Place 18 mcg into inhaler and inhale daily.   Yes [provider]  traZODone (DESYREL) 50 MG tablet Take 50 mg by mouth at bedtime.   Yes [provider]  albuterol (PROVENTIL HFA) 108 (90 Base) MCG/ACT inhaler Inhale 2 puffs into the lungs every 4 (four) hours as needed for wheezing or shortness of breath.    [provider]  amitriptyline (ELAVIL) 25 MG tablet TAKE 1 TABLET BY MOUTH EVERY DAY AT NIGHT 04/27/20   [provider]  baclofen (LIORESAL) 10 MG tablet Take 20 mg by mouth 3 (three) times daily.     [provider]  busPIRone (BUSPAR) 5 MG tablet Take 15 mg by mouth 3 (three) times daily.    [provider]  chlorthalidone (HYGROTON) 25 MG tablet Take 25 mg by mouth daily.    [provider]  clobetasol cream (TEMOVATE) 9.35 % Apply 1 application topically 2 (two) times daily as needed. Apply to hands and feet twice daily as  needed    [provider]  ipratropium (ATROVENT HFA) 17 MCG/ACT inhaler Inhale 2 puffs into the lungs every 4 (four) hours as needed for wheezing.    [provider]  lamoTRIgine (LAMICTAL) 100 MG tablet Take 300 mg by mouth every evening.     [provider]  loratadine (CLARITIN) 10 MG tablet Take 10 mg by mouth daily.    [provider]  nystatin (MYCOSTATIN) 100000 UNIT/ML suspension Take 5 mLs (500,000 Units total) by mouth 4 (four) times daily. Swish in the mouth and retain for as long as possible before swallowing. 07/29/20   Coral Spikes, DO  omeprazole (PRILOSEC) 20 MG capsule Take  20 mg by mouth daily.    [provider]  promethazine (PHENERGAN) 25 MG tablet Take 25 mg by mouth every 6 (six) hours as needed for nausea or vomiting.    [provider]  ranitidine (ZANTAC) 150 MG tablet Take 150 mg by mouth 2 (two) times daily.    [provider]  SUMAtriptan (IMITREX) 20 MG/ACT nasal spray Place 20 mg into the nose every 2 (two) hours as needed for migraine or headache. May repeat in 2 hours if headache persists or recurs.    [provider]  tretinoin (RETIN-A) 0.025 % gel Apply topically at bedtime.    [provider]  varenicline (CHANTIX) 1 MG tablet Take 1 mg by mouth 2 (two) times daily.    [provider]    Family History History reviewed. No pertinent family history.  Social History Social History   Tobacco Use   Smoking status: Every Day   Smokeless tobacco: Never  Vaping Use   Vaping Use: Never used  Substance Use Topics   Alcohol use: No   Drug use: No     Allergies   Patient has no known allergies.   Review of Systems Review of Systems  Constitutional:  Negative for fever.  HENT:  Positive for congestion.   Respiratory:  Positive for cough. Negative for shortness of breath and wheezing.   Cardiovascular:  Negative for chest pain and palpitations.  Gastrointestinal:  Negative for vomiting.  All other systems reviewed and are negative.   Physical Exam Triage Vital Signs ED Triage Vitals  Enc Vitals Group     BP 10/02/21 1523 (!) 122/57     Pulse Rate 10/02/21 1523 72     Resp 10/02/21 1523 15     Temp 10/02/21 1523 98.4 F (36.9 C)     Temp Source 10/02/21 1523 Oral     SpO2 10/02/21 1523 97 %     Weight 10/02/21 1519 149 lb 14.6 oz (68 kg)     Height 10/02/21 1519 5\' 5"  (1.651 m)     Head Circumference --      Peak Flow --      Pain Score 10/02/21 1519 0     Pain Loc --      Pain Edu? --      Excl. in [place]? --    No data found.  Updated Vital Signs BP (!) 122/57 (BP  Location: Left Arm)    Pulse 72    Temp 98.4 F (36.9 C) (Oral)    Resp 15    Ht 5\' 5"  (1.651 m)    Wt 149 lb 14.6 oz (68 kg)    SpO2 97%    BMI 24.95 kg/m   Visual Acuity Right Eye Distance:   Left Eye Distance:   Bilateral  Distance:    Right Eye Near:   Left Eye Near:    Bilateral Near:     Physical Exam Vitals and nursing note reviewed.  Constitutional:      General: She is not in acute distress.    Appearance: She is well-developed.  HENT:     Head: Normocephalic.     Right Ear: Tympanic membrane is retracted.     Left Ear: Tympanic membrane is retracted.     Nose: Congestion present.     Mouth/Throat:     Lips: Pink.     Mouth: Mucous membranes are moist.     Pharynx: Oropharynx is clear.  Eyes:     General: Lids are normal.     Conjunctiva/sclera: Conjunctivae normal.     Pupils: Pupils are equal, round, and reactive to light.  Neck:     Trachea: No tracheal deviation.  Cardiovascular:     Rate and Rhythm: Normal rate and regular rhythm.     Pulses: Normal pulses.     Heart sounds: Normal heart sounds. No murmur heard. Pulmonary:     Effort: Pulmonary effort is normal.     Breath sounds: Normal breath sounds and air entry.  Abdominal:     General: Bowel sounds are normal.     Palpations: Abdomen is soft.     Tenderness: There is no abdominal tenderness.  Musculoskeletal:        General: Normal range of motion.     Cervical back: Normal range of motion.  Lymphadenopathy:     Cervical: No cervical adenopathy.  Skin:    General: Skin is warm and dry.     Findings: No rash.  Neurological:     General: No focal deficit present.     Mental Status: She is alert and oriented to person, place, and time.     GCS: GCS eye subscore is 4. GCS verbal subscore is 5. GCS motor subscore is 6.  Psychiatric:        Attention and Perception: Attention normal.        Mood and Affect: Mood normal.        Speech: Speech normal.        Behavior: Behavior normal. Behavior is  cooperative.     UC Treatments / Results  Labs (all labs ordered are listed, but only abnormal results are displayed) Labs Reviewed - No data to display  EKG   Radiology DG Chest 2 View  Result Date: 10/02/2021 CLINICAL DATA:  Cough and congestion for 3 weeks. EXAM: CHEST - 2 VIEW COMPARISON:  January 05, 2018 FINDINGS: Haziness over the bases consistent with overlapping soft tissues. There may be mild bibasilar atelectasis. The cardiomediastinal silhouette is stable. No pneumothorax. The lungs are clear. Hyperinflation of the lungs with flattening of the diaphragms on the lateral view. No other acute abnormalities identified. The cardiomediastinal silhouette is stable. IMPRESSION: 1. Hyperinflation of the lungs suggesting COPD or emphysema. No other abnormalities. Electronically Signed   By: Dorise Bullion III M.D.   On: 10/02/2021 15:50    Procedures Procedures (including critical care time)  Medications Ordered in UC Medications - No data to display  Initial Impression / Assessment and Plan / UC Course  I have reviewed the triage vital signs and the nursing notes.  Pertinent labs & imaging results that were available during my care of the patient were reviewed by me and considered in my medical decision making (see chart for details).    Ddx: COPD,  URI, Viral illness, allergies, pneumonia Final Clinical Impressions(s) / UC Diagnoses   Final diagnoses:  COPD without exacerbation ([place])  Smoker     Discharge Instructions      Stop smoking. Your xray was negative for acute findings,no pneumonia. Follow up with PCP. Continue home meds.      ED Prescriptions   None    PDMP not reviewed this encounter.   Tori Milks, NP 54/49/20 1645

## 2021-10-02 NOTE — ED Triage Notes (Signed)
Patient c/o sinus congestion, runny nose, cough, chest congestion that started 3 weeks ago.  Patient denies fevers.

## 2021-10-02 NOTE — Discharge Instructions (Addendum)
Stop smoking. Your xray was negative for acute findings,no pneumonia. Follow up with PCP. Continue home meds.

## 2022-06-01 ENCOUNTER — Ambulatory Visit
Admission: EM | Admit: 2022-06-01 | Discharge: 2022-06-01 | Disposition: A | Discharge status: Home / Self Care | Payer: Medicare HMO | Attending: Family Medicine | Admitting: Family Medicine

## 2022-06-01 DIAGNOSIS — R0602 Shortness of breath: Secondary | ICD-10-CM

## 2022-06-01 DIAGNOSIS — R051 Acute cough: Secondary | ICD-10-CM

## 2022-06-01 DIAGNOSIS — Z79899 Other long term (current) drug therapy: Secondary | ICD-10-CM | POA: Insufficient documentation

## 2022-06-01 DIAGNOSIS — Z7952 Long term (current) use of systemic steroids: Secondary | ICD-10-CM | POA: Insufficient documentation

## 2022-06-01 DIAGNOSIS — J441 Chronic obstructive pulmonary disease with (acute) exacerbation: Secondary | ICD-10-CM

## 2022-06-01 DIAGNOSIS — Z20822 Contact with and (suspected) exposure to covid-19: Secondary | ICD-10-CM | POA: Diagnosis not present

## 2022-06-01 LAB — SARS CORONAVIRUS 2 BY RT PCR: SARS Coronavirus 2 by RT PCR: NEGATIVE

## 2022-06-01 MED ORDER — DOXYCYCLINE HYCLATE 100 MG PO CAPS: 100.0000 mg | ORAL_CAPSULE | Freq: Two times a day (BID) | ORAL | 0 refills | Status: AC

## 2022-06-01 MED ORDER — PREDNISONE 20 MG PO TABS
40.0000 mg | ORAL_TABLET | Freq: Every day | ORAL | 0 refills | Status: AC
Start: 2022-06-01 — End: 2022-06-06

## 2022-06-01 NOTE — ED Triage Notes (Signed)
Pt c/o headache, SOB, fevers, pt states sxs onset x over a week. Cough, lower abdominal pain. Daughter states O2 has been 91-92% today

## 2022-06-01 NOTE — ED Provider Notes (Signed)
[facility]    CSN: [id number] Arrival date & time: 06/01/22  1831      History   Chief Complaint Chief Complaint  Patient presents with   Headache   Shortness of Breath   Fever    HPI Stephanie Jones is a 71 y.o. female with a history of COPD, stroke, migraines, hypertension and breast cancer.  Patient presents today for approximately 1 week history of low-grade fever, fatigue, headaches, shortness of breath and productive cough.  Patient's daughter reports her oxygen has been decreased to 91 to 92% at home although does increase when she has a nebulized breathing treatment.  Patient reports his symptoms are getting worse and not better.  She reports being extremely fatigued.  Patient's daughter states that she does not resting tries to clean the house and be too active.  Patient denies any known COVID exposure.  No other complaints.  HPI  Past Medical History:  Diagnosis Date   Cancer [facility])    breast   COPD (chronic obstructive pulmonary disease) (HCC)    GERD (gastroesophageal reflux disease)    Hypertension    Migraines    Stroke (cerebrum) [facility])     Patient Active Problem List   Diagnosis Date Noted   COPD without exacerbation ([place]) 10/02/2021   Smoker 10/02/2021   Altered mental status 09/17/2018    Past Surgical History:  Procedure Laterality Date   ABDOMINAL HYSTERECTOMY     BRAIN SURGERY      OB History   No obstetric history on file.      Home Medications    Prior to Admission medications   Medication Sig Start Date End Date Taking? Authorizing Provider  albuterol (PROVENTIL HFA) 108 (90 Base) MCG/ACT inhaler Inhale 2 puffs into the lungs every 4 (four) hours as needed for wheezing or shortness of breath.   Yes [provider]  amitriptyline (ELAVIL) 25 MG tablet TAKE 1 TABLET BY MOUTH EVERY DAY AT NIGHT 04/27/20  Yes [provider]  baclofen (LIORESAL) 10 MG tablet Take 20 mg by mouth 3 (three) times daily.    Yes  [provider]  busPIRone (BUSPAR) 5 MG tablet Take 15 mg by mouth 3 (three) times daily.   Yes [provider]  chlorthalidone (HYGROTON) 25 MG tablet Take 25 mg by mouth daily.   Yes [provider]  clobetasol cream (TEMOVATE) 9.02 % Apply 1 application topically 2 (two) times daily as needed. Apply to hands and feet twice daily as needed   Yes [provider]  clonazePAM (KLONOPIN) 1 MG tablet Take 1 mg by mouth 3 (three) times daily.   Yes [provider]  clonazePAM [facility] [place]) 1 MG tablet Take by mouth. 05/18/22 06/17/22 Yes [provider]  doxycycline (VIBRAMYCIN) 100 MG capsule Take 1 capsule (100 mg total) by mouth 2 (two) times daily for 7 days. 06/01/22 06/08/22 Yes Laurene Footman B, PA-C  fluticasone (FLONASE) 50 MCG/ACT nasal spray Place 1 spray into both nostrils daily.   Yes [provider]  fluticasone (FLONASE) 50 MCG/ACT nasal spray 1 spray into each nostril daily. 08/23/21  Yes [provider]  fluticasone furoate-vilanterol (BREO ELLIPTA) 100-25 MCG/INH AEPB Inhale 1 puff into the lungs daily.   Yes [provider]  gabapentin (NEURONTIN) 100 MG capsule Take 100 mg by mouth 3 (three) times daily as needed.   Yes [provider]  ipratropium (ATROVENT HFA) 17 MCG/ACT inhaler Inhale 2 puffs into the lungs every 4 (  four) hours as needed for wheezing.   Yes [provider]  lamoTRIgine (LAMICTAL) 100 MG tablet Take 300 mg by mouth every evening.    Yes [provider]  lisinopril (PRINIVIL,ZESTRIL) 20 MG tablet Take 20 mg by mouth daily.   Yes [provider]  loratadine (CLARITIN) 10 MG tablet Take 10 mg by mouth daily.   Yes [provider]  naloxone [facility]) nasal spray 4 mg/0.1 mL One spray in either nostril once for known/suspected opioid overdose. May repeat every 2-3 minutes in alternating nostril til EMS arrives 04/21/21  Yes [provider]   nystatin (MYCOSTATIN) 100000 UNIT/ML suspension Take 5 mLs (500,000 Units total) by mouth 4 (four) times daily. Swish in the mouth and retain for as long as possible before swallowing. 07/29/20  Yes Cook, Jayce G, DO  omeprazole (PRILOSEC) 20 MG capsule Take 20 mg by mouth daily.   Yes [provider]  pravastatin (PRAVACHOL) 40 MG tablet Take 40 mg by mouth daily.    Yes [provider]  predniSONE (DELTASONE) 20 MG tablet Take 2 tablets (40 mg total) by mouth daily for 5 days. 06/01/22 06/06/22 Yes Danton Clap, PA-C  promethazine (PHENERGAN) 25 MG tablet Take 25 mg by mouth every 6 (six) hours as needed for nausea or vomiting.   Yes [provider]  ranitidine (ZANTAC) 150 MG tablet Take 150 mg by mouth 2 (two) times daily.   Yes [provider]  SUMAtriptan (IMITREX) 20 MG/ACT nasal spray Place 20 mg into the nose every 2 (two) hours as needed for migraine or headache. May repeat in 2 hours if headache persists or recurs.   Yes [provider]  tiotropium (SPIRIVA) 18 MCG inhalation capsule Place 18 mcg into inhaler and inhale daily.   Yes [provider]  traZODone (DESYREL) 50 MG tablet Take 50 mg by mouth at bedtime.   Yes [provider]  tretinoin (RETIN-A) 0.025 % gel Apply topically at bedtime.   Yes [provider]  varenicline (CHANTIX) 1 MG tablet Take 1 mg by mouth 2 (two) times daily.   Yes [provider]  atorvastatin (LIPITOR) 80 MG tablet Take 80 mg by mouth daily. 03/30/22   [provider]  diltiazem (CARDIZEM CD) 180 MG 24 hr capsule Take 180 mg by mouth daily. 04/28/22   [provider]  furosemide (LASIX) 20 MG tablet Take 20 mg by mouth daily. 03/29/22   [provider]  ipratropium-albuterol (DUONEB) 0.5-2.5 (3) MG/3ML SOLN Take by nebulization. 12/21/21   [provider]  metoprolol tartrate (LOPRESSOR) 25 MG tablet Take by mouth. 04/08/20 10/02/21  [provider]  NURTEC 75 MG TBDP Take 1 tablet by mouth every morning. 05/17/22   [provider]  sertraline (ZOLOFT) 50 MG tablet Take 150 mg by mouth daily. 03/14/22   [provider]  SUMAtriptan (IMITREX) 50 MG tablet SMARTSIG:0.5 Tablet(s) By Mouth Once PRN 04/14/22   [provider]  tamsulosin (FLOMAX) 0.4 MG CAPS capsule Take 0.4 mg by mouth at bedtime. 05/15/22   [provider]  tretinoin (RETIN-A) 0.025 % cream Apply 1 Application topically at bedtime. 04/11/22   [provider]  WIXELA INHUB 500-50 MCG/ACT AEPB 1 puff 2 (two) times daily. 05/11/22   [provider]    Family History History reviewed. No pertinent family history.  Social History Social History   Tobacco Use   Smoking status: Every Day   Smokeless tobacco: Never  Vaping Use   Vaping Use: Never used  Substance Use Topics   Alcohol use: No   Drug use: No     Allergies   Varenicline   Review of Systems Review of Systems  Constitutional:  Positive for fatigue and fever. Negative for chills and diaphoresis.  HENT:  Positive for congestion, rhinorrhea and sinus pressure. Negative for ear pain and sore throat.   Respiratory:  Positive for cough, shortness of breath and wheezing.   Cardiovascular:  Negative for chest pain.  Gastrointestinal:  Negative for abdominal pain, nausea and vomiting.  Musculoskeletal:  Negative for arthralgias and myalgias.  Skin:  Negative for rash.  Neurological:  Positive for headaches. Negative for weakness.  Hematological:  Negative for adenopathy.     Physical Exam Triage Vital Signs ED Triage Vitals  Enc Vitals Group     BP 06/01/22 1853 (!) 112/54     Pulse Rate 06/01/22 1853 68     Resp --      Temp 06/01/22 1853 99 F (37.2 C)     Temp Source 06/01/22 1853 Oral     SpO2 06/01/22 1853 94 %     Weight 06/01/22 1859 151 lb 3.2 oz (68.6 kg)     Height 06/01/22 1852 '5\' 4"'$  (1.626 m)     Head Circumference --       Peak Flow --      Pain Score 06/01/22 1851 7     Pain Loc --      Pain Edu? --      Excl. in [place]? --    No data found.  Updated Vital Signs BP (!) 112/54   Pulse 68   Temp 99 F (37.2 C) (Oral)   Ht '5\' 4"'$  (1.626 m)   Wt 151 lb 3.2 oz (68.6 kg)   SpO2 94%   BMI 25.95 kg/m      Physical Exam Vitals and nursing note reviewed.  Constitutional:      General: She is not in acute distress.    Appearance: Normal appearance. She is ill-appearing. She is not toxic-appearing.  HENT:     Head: Normocephalic and atraumatic.     Right Ear: Tympanic membrane, ear canal and external ear normal.     Left Ear: Tympanic membrane, ear canal and external ear normal.     Nose: Congestion present.     Mouth/Throat:     Mouth: Mucous membranes are moist.     Pharynx: Oropharynx is clear.  Eyes:     General: No scleral icterus.       Right eye: No discharge.        Left eye: No discharge.     Conjunctiva/sclera: Conjunctivae normal.  Cardiovascular:     Rate and Rhythm: Normal rate and regular rhythm.     Heart sounds: Normal heart sounds.  Pulmonary:     Effort: Pulmonary effort is normal. No respiratory distress.     Breath sounds: Wheezing and rhonchi present.     Comments: Diffuse wheezing and rhonchi throughout all lung fields Musculoskeletal:     Cervical back: Neck supple.  Skin:    General: Skin is dry.  Neurological:     General: No focal deficit present.     Mental Status: She is alert. Mental status is at baseline.     Motor: No weakness.     Gait: Gait normal.  Psychiatric:        Mood and Affect: Mood normal.  Behavior: Behavior normal.        Thought Content: Thought content normal.      UC Treatments / Results  Labs (all labs ordered are listed, but only abnormal results are displayed) Labs Reviewed  SARS CORONAVIRUS 2 BY RT PCR    EKG   Radiology No results found.  Procedures Procedures (including critical care time)  Medications Ordered in  UC Medications - No data to display  Initial Impression / Assessment and Plan / UC Course  I have reviewed the triage vital signs and the nursing notes.  Pertinent labs & imaging results that were available during my care of the patient were reviewed by me and considered in my medical decision making (see chart for details).   71 year old female with history of COPD presents for approximately 1 week history of low-grade fever, fatigue, productive cough and increased shortness of breath from baseline.  Has been taking Robitussin at home and using her inhalers and breathing treatments.  Symptoms not improving at home.  Vitals are stable.  She is ill-appearing but nontoxic.  On exam she has nasal congestion as well as diffuse wheezing and rhonchi throughout all lung fields.  No respiratory distress.  PCR COVID test performed and negative.  Advised patient symptoms are consistent with COPD exacerbation.  We will treat with doxycycline and prednisone at this time.  We will have her continue breathing treatments at home and Robitussin as well as increase rest and fluids.  Advised following up with her PCP in the next few days for recheck.  Advised return or go to emergency department for any acute worsening symptoms including if she develops a fever, worsening cough or increased shortness of breath.  Patient has acute illness with systemic symptoms and exacerbation of chronic condition.    Final Clinical Impressions(s) / UC Diagnoses   Final diagnoses:  COPD exacerbation (HCC)  Shortness of breath  Acute cough     Discharge Instructions      -We will call if the COVID test is positive.  If you do not hear from me negative and symptoms are due to a COPD exacerbation.  I have sent antibiotics and prednisone to the pharmacy.  Continue breathing treatments and inhalers at home.  Plenty rest and fluids. - Symptoms should be improving over the next few days but if you believe the oxygen is  dropping and you becoming more short of breath, develop a fever, have worsening cough or pain in your chest, please go to emergency department for further work-up.     ED Prescriptions     Medication Sig Dispense Auth. Provider   predniSONE (DELTASONE) 20 MG tablet Take 2 tablets (40 mg total) by mouth daily for 5 days. 10 tablet Laurene Footman B, PA-C   doxycycline (VIBRAMYCIN) 100 MG capsule Take 1 capsule (100 mg total) by mouth 2 (two) times daily for 7 days. 14 capsule Danton Clap, PA-C      PDMP not reviewed this encounter.   Danton Clap, PA-C 06/01/22 1934

## 2022-06-01 NOTE — Discharge Instructions (Addendum)
-  We will call if the COVID test is positive.  If you do not hear from me negative and symptoms are due to a COPD exacerbation.  I have sent antibiotics and prednisone to the pharmacy.  Continue breathing treatments and inhalers at home.  Plenty rest and fluids. - Symptoms should be improving over the next few days but if you believe the oxygen is dropping and you becoming more short of breath, develop a fever, have worsening cough or pain in your chest, please go to emergency department for further work-up.

## 2022-12-21 ENCOUNTER — Ambulatory Visit
Admission: EM | Admit: 2022-12-21 | Discharge: 2022-12-21 | Disposition: A | Discharge status: Home / Self Care | Payer: Medicare HMO | Attending: Emergency Medicine | Admitting: Emergency Medicine

## 2022-12-21 ENCOUNTER — Ambulatory Visit (INDEPENDENT_AMBULATORY_CARE_PROVIDER_SITE_OTHER): Payer: Medicare HMO

## 2022-12-21 DIAGNOSIS — J441 Chronic obstructive pulmonary disease with (acute) exacerbation: Secondary | ICD-10-CM | POA: Diagnosis not present

## 2022-12-21 MED ORDER — AEROCHAMBER MV MISC: 2 refills | Status: DC

## 2022-12-21 MED ORDER — LEVOFLOXACIN 500 MG PO TABS: 500.0000 mg | ORAL_TABLET | Freq: Every day | ORAL | 0 refills | Status: DC

## 2022-12-21 MED ORDER — PREDNISONE 20 MG PO TABS: 60.0000 mg | ORAL_TABLET | Freq: Every day | ORAL | 0 refills | Status: AC

## 2022-12-21 NOTE — ED Provider Notes (Signed)
[facility]    CSN: [id number] Arrival date & time: 12/21/22  1251      History   Chief Complaint Chief Complaint  Patient presents with   Cough   Shortness of Breath    HPI Stephanie Jones is a 72 y.o. female.   HPI  72 year old female with a past medical history significant for breast cancer, COPD, GERD, hypertension, and CVA presents for evaluation of cough and shortness of breath for the last 3 weeks.  In the last 2 days she developed pain in her back and weakness.  She is using over-the-counter cough syrup for the cough without significant improvement.  She was evaluated in the ER 3 weeks ago and treated with steroids but she is not any better.  Past Medical History:  Diagnosis Date   Cancer [facility])    breast   COPD (chronic obstructive pulmonary disease) (HCC)    GERD (gastroesophageal reflux disease)    Hypertension    Migraines    Stroke (cerebrum) [facility])     Patient Active Problem List   Diagnosis Date Noted   COPD without exacerbation ([place]) 10/02/2021   Smoker 10/02/2021   Altered mental status 09/17/2018    Past Surgical History:  Procedure Laterality Date   ABDOMINAL HYSTERECTOMY     BRAIN SURGERY      OB History   No obstetric history on file.      Home Medications    Prior to Admission medications   Medication Sig Start Date End Date Taking? Authorizing Provider  levofloxacin (LEVAQUIN) 500 MG tablet Take 1 tablet (500 mg total) by mouth daily. 12/21/22  Yes Margarette Canada, NP  predniSONE (DELTASONE) 20 MG tablet Take 3 tablets (60 mg total) by mouth daily with breakfast for 5 days. 3 tablets daily for 5 days. 12/21/22 12/26/22 Yes Margarette Canada, NP  albuterol (PROVENTIL HFA) 108 (90 Base) MCG/ACT inhaler Inhale 2 puffs into the lungs every 4 (four) hours as needed for wheezing or shortness of breath.    [provider]  amitriptyline (ELAVIL) 25 MG tablet TAKE 1 TABLET BY MOUTH EVERY DAY AT NIGHT 04/27/20   [provider]   atorvastatin (LIPITOR) 80 MG tablet Take 80 mg by mouth daily. 03/30/22   [provider]  baclofen (LIORESAL) 10 MG tablet Take 20 mg by mouth 3 (three) times daily.     [provider]  busPIRone (BUSPAR) 5 MG tablet Take 15 mg by mouth 3 (three) times daily.    [provider]  chlorthalidone (HYGROTON) 25 MG tablet Take 25 mg by mouth daily.    [provider]  clobetasol cream (TEMOVATE) [id number] % Apply 1 application topically 2 (two) times daily as needed. Apply to hands and feet twice daily as needed    [provider]  clonazePAM (KLONOPIN) 1 MG tablet Take 1 mg by mouth 3 (three) times daily.    [provider]  clonazePAM [place] [place]) 1 MG tablet Take by mouth. 05/18/22 06/17/22  [provider]  diltiazem (CARDIZEM CD) 180 MG 24 hr capsule Take 180 mg by mouth daily. 04/28/22   [provider]  fluticasone (FLONASE) 50 MCG/ACT nasal spray Place 1 spray into both nostrils daily.    [provider]  fluticasone (FLONASE) 50 MCG/ACT nasal spray 1 spray into each nostril daily. 08/23/21   [provider]  fluticasone furoate-vilanterol (BREO ELLIPTA) 100-25 MCG/INH AEPB Inhale 1 puff into the lungs daily.    [provider]  furosemide (LASIX) 20 MG tablet Take 20 mg by mouth daily. 03/29/22   [provider]  gabapentin (NEURONTIN) 100 MG capsule Take 100 mg by mouth 3 (three) times daily as needed.    [provider]  ipratropium (ATROVENT HFA) 17 MCG/ACT inhaler Inhale 2 puffs into the lungs every 4 (four) hours as needed for wheezing.    [provider]  ipratropium-albuterol (DUONEB) 0.5-2.5 (3) MG/3ML SOLN Take by nebulization. 12/21/21   [provider]  lamoTRIgine (LAMICTAL) 100 MG tablet Take 300 mg by mouth every evening.     [provider]  lisinopril (PRINIVIL,ZESTRIL) 20 MG tablet Take 20 mg by mouth daily.    [provider]  loratadine  (CLARITIN) 10 MG tablet Take 10 mg by mouth daily.    [provider]  metoprolol tartrate (LOPRESSOR) 25 MG tablet Take by mouth. 04/08/20 10/02/21  [provider]  naloxone [facility]) nasal spray 4 mg/0.1 mL One spray in either nostril once for known/suspected opioid overdose. May repeat every 2-3 minutes in alternating nostril til EMS arrives 04/21/21   [provider]  NURTEC 75 MG TBDP Take 1 tablet by mouth every morning. 05/17/22   [provider]  nystatin (MYCOSTATIN) 100000 UNIT/ML suspension Take 5 mLs (500,000 Units total) by mouth 4 (four) times daily. Swish in the mouth and retain for as long as possible before swallowing. 07/29/20   Coral Spikes, DO  omeprazole (PRILOSEC) 20 MG capsule Take 20 mg by mouth daily.    [provider]  pravastatin (PRAVACHOL) 40 MG tablet Take 40 mg by mouth daily.     [provider]  promethazine (PHENERGAN) 25 MG tablet Take 25 mg by mouth every 6 (six) hours as needed for nausea or vomiting.    [provider]  ranitidine (ZANTAC) 150 MG tablet Take 150 mg by mouth 2 (two) times daily.    [provider]  sertraline (ZOLOFT) 50 MG tablet Take 150 mg by mouth daily. 03/14/22   [provider]  SUMAtriptan (IMITREX) 20 MG/ACT nasal spray Place 20 mg into the nose every 2 (two) hours as needed for migraine or headache. May repeat in 2 hours if headache persists or recurs.    [provider]  SUMAtriptan (IMITREX) 50 MG tablet SMARTSIG:0.5 Tablet(s) By Mouth Once PRN 04/14/22   [provider]  tamsulosin (FLOMAX) 0.4 MG CAPS capsule Take 0.4 mg by mouth at bedtime. 05/15/22   [provider]  tiotropium (SPIRIVA) 18 MCG inhalation capsule Place 18 mcg into inhaler and inhale daily.    [provider]  traZODone (DESYREL) 50 MG tablet Take 50 mg by mouth at bedtime.    [provider]  tretinoin (RETIN-A) 0.025 % cream Apply 1 Application  topically at bedtime. 04/11/22   [provider]  tretinoin (RETIN-A) 0.025 % gel Apply topically at bedtime.    [provider]  varenicline (CHANTIX) 1 MG tablet Take 1 mg by mouth 2 (two) times daily.    [provider]  Grant Ruts INHUB 500-50 MCG/ACT AEPB 1 puff 2 (two) times daily. 05/11/22   [provider]    Family History History reviewed. No pertinent family history.  Social History Social History   Tobacco Use   Smoking status: Every Day   Smokeless tobacco: Never  Vaping Use   Vaping Use: Never used  Substance Use Topics   Alcohol use: No   Drug use: No     Allergies  Varenicline   Review of Systems Review of Systems  Constitutional:  Negative for fever.  Respiratory:  Positive for cough, shortness of breath and wheezing.   Musculoskeletal:  Positive for back pain.     Physical Exam Triage Vital Signs ED Triage Vitals [12/21/22 1314]  Enc Vitals Group     BP 113/60     Pulse Rate 71     Resp 20     Temp 98.1 F (36.7 C)     Temp Source Oral     SpO2 97 %     Weight      Height      Head Circumference      Peak Flow      Pain Score 6     Pain Loc      Pain Edu?      Excl. in [place]?    No data found.  Updated Vital Signs BP 113/60 (BP Location: Left Arm)   Pulse 71   Temp 98.1 F (36.7 C) (Oral)   Resp 20   SpO2 97%   Visual Acuity Right Eye Distance:   Left Eye Distance:   Bilateral Distance:    Right Eye Near:   Left Eye Near:    Bilateral Near:     Physical Exam Vitals and nursing note reviewed.  Constitutional:      Appearance: Normal appearance. She is not ill-appearing.  HENT:     Head: Normocephalic and atraumatic.  Cardiovascular:     Rate and Rhythm: Normal rate and regular rhythm.     Pulses: Normal pulses.     Heart sounds: Normal heart sounds. No murmur heard.    No friction rub. No gallop.  Pulmonary:     Effort: Pulmonary effort is normal.     Breath sounds: Wheezing present.      Comments: Patient has markedly decreased lung sounds in all lung fields with scattered end inspiratory wheezing. Musculoskeletal:        General: Tenderness present. Normal range of motion.     Comments: Patient has tenderness in her mid thoracic bilateral paravertebral region without overt spasm.  No midline spinous process tenderness or step-off.  Skin:    General: Skin is warm and dry.     Capillary Refill: Capillary refill takes less than 2 seconds.  Neurological:     Mental Status: She is alert.      UC Treatments / Results  Labs (all labs ordered are listed, but only abnormal results are displayed) Labs Reviewed - No data to display  EKG   Radiology DG Chest 2 View  Result Date: 12/21/2022 CLINICAL DATA:  Cough and shortness of breath for 3 weeks. History of emphysema. Back pain and weakness. EXAM: CHEST - 2 VIEW COMPARISON:  10/02/2021 FINDINGS: Airway thickening is present, suggesting bronchitis or reactive airways disease. Tapering of the peripheral pulmonary vasculature favors emphysema. Cardiac and mediastinal margins appear normal. No blunting of costophrenic angles. Mild thoracic spondylosis. IMPRESSION: 1. Airway thickening is present, suggesting bronchitis or reactive airways disease. 2. Tapering of the peripheral pulmonary vasculature favors emphysema. Electronically Signed   By: Van Clines M.D.   On: 12/21/2022 13:49    Procedures Procedures (including critical care time)  Medications Ordered in UC Medications - No data to display  Initial Impression / Assessment and Plan / UC Course  I have reviewed the triage vital signs and the nursing notes.  Pertinent labs & imaging results that were available during my care of the  patient were reviewed by me and considered in my medical decision making (see chart for details).   Patient is a pleasant 72 year old female who has a history of COPD and is still smoking presenting for evaluation of 3 weeks worth of  shortness of breath, wheezing, and cough.  Her cough is intermittently productive.  She is using her albuterol inhaler at home but she states she is no longer using the [employer].  She was evaluated at [facility] 3 weeks ago and diagnosed with a COPD exacerbation and placed on steroids.  The steroids did improve her breathing.  Reports that she has a mildly elevated temp at nighttime only in the realm of 99.8.  Nothing over 100.  She is able to speak in full sentences without dyspnea or tachypnea.  Her respiratory rate here in clinic is 20 with a room air oxygen saturation of 97%.  Chest wall auscultation reveals markedly decreased lung sounds diffusely with scattered end inspiratory wheezing.  I will order chest x-ray to look for any acute cardiopulmonary pathology.  Radiology impression of chest x-ray states airway thickening is present which is suggestive of bronchitis or reactive airway disease.  There is a tapering of the peripheral pulmonary vascular favoring emphysema.  I will discharge patient home with diagnosis of COPD exacerbation.  I will put her on a 5-day burst dose of prednisone 60 mg daily and have her continue to use her albuterol inhaler every 4-6 hours to help with wheezing.  She is to make a follow-up appointment with her PCP to discuss inhaled steroids to help with her COPD.  I also cover her for potential bacterial infections with Levaquin 500 milligrams once daily for 7 days.   Final Clinical Impressions(s) / UC Diagnoses   Final diagnoses:  COPD exacerbation [facility])     Discharge Instructions      Your chest x-ray did not show any evidence of pneumonia but it does show that you have COPD.  We are going to treat your active flare with a combination of steroids and antibiotics.  Take the Levaquin once daily for 7 days for treatment of your COPD exacerbation.  Continue to use your albuterol inhaler, 2 puffs every 4-6 hours with a spacer, as needed for shortness of  breath and wheezing.  Start taking prednisone 60 mg daily for 5 days.  Take your first dose when you pick up from the pharmacy and take all subsequent doses at breakfast.  Keep your appointment with your primary care provider on April 8 as previously scheduled.  You need to discuss with her better ways to control your COPD.  You need to stop smoking.     ED Prescriptions     Medication Sig Dispense Auth. Provider   predniSONE (DELTASONE) 20 MG tablet Take 3 tablets (60 mg total) by mouth daily with breakfast for 5 days. 3 tablets daily for 5 days. 15 tablet Margarette Canada, NP   levofloxacin (LEVAQUIN) 500 MG tablet Take 1 tablet (500 mg total) by mouth daily. 7 tablet Margarette Canada, NP      PDMP not reviewed this encounter.   Margarette Canada, NP 12/21/22 1407

## 2022-12-21 NOTE — Discharge Instructions (Addendum)
Your chest x-ray did not show any evidence of pneumonia but it does show that you have COPD.  We are going to treat your active flare with a combination of steroids and antibiotics.  Take the Levaquin once daily for 7 days for treatment of your COPD exacerbation.  Continue to use your albuterol inhaler, 2 puffs every 4-6 hours with a spacer, as needed for shortness of breath and wheezing.  Start taking prednisone 60 mg daily for 5 days.  Take your first dose when you pick up from the pharmacy and take all subsequent doses at breakfast.  Keep your appointment with your primary care provider on April 8 as previously scheduled.  You need to discuss with her better ways to control your COPD.  You need to stop smoking.

## 2022-12-21 NOTE — ED Triage Notes (Signed)
Patient presents to UC for cough, SOB x 3 weeks. Reports back pain and weakness x 2 days. Treating cough with cough syrup. Was seen in the ED for cough. Treated with a steroid 3 weeks ago. Daughter states not getting any better. Hx of pneumonia and COPD.

## 2023-01-30 ENCOUNTER — Ambulatory Visit (INDEPENDENT_AMBULATORY_CARE_PROVIDER_SITE_OTHER): Payer: Medicare HMO

## 2023-01-30 ENCOUNTER — Encounter: Payer: Self-pay | Admitting: Emergency Medicine

## 2023-01-30 ENCOUNTER — Ambulatory Visit
Admission: EM | Admit: 2023-01-30 | Discharge: 2023-01-30 | Disposition: A | Discharge status: Home / Self Care | Payer: Medicare HMO | Attending: Physician Assistant | Admitting: Physician Assistant

## 2023-01-30 DIAGNOSIS — J441 Chronic obstructive pulmonary disease with (acute) exacerbation: Secondary | ICD-10-CM | POA: Diagnosis not present

## 2023-01-30 DIAGNOSIS — R051 Acute cough: Secondary | ICD-10-CM

## 2023-01-30 DIAGNOSIS — R0600 Dyspnea, unspecified: Secondary | ICD-10-CM

## 2023-01-30 MED ORDER — DOXYCYCLINE HYCLATE 100 MG PO CAPS
100.0000 mg | ORAL_CAPSULE | Freq: Two times a day (BID) | ORAL | 0 refills | Status: AC
Start: 2023-01-30 — End: 2023-02-06

## 2023-01-30 MED ORDER — DEXAMETHASONE SODIUM PHOSPHATE 10 MG/ML IJ SOLN
10.0000 mg | Freq: Once | INTRAMUSCULAR | Status: AC
  Administered 2023-01-30: 10 mg via INTRAMUSCULAR

## 2023-01-30 MED ORDER — PROMETHAZINE-DM 6.25-15 MG/5ML PO SYRP: 5.0000 mL | ORAL_SOLUTION | Freq: Four times a day (QID) | ORAL | 0 refills | Status: AC | PRN

## 2023-01-30 MED ORDER — CEFTRIAXONE SODIUM 1 G IJ SOLR
1.0000 g | INTRAMUSCULAR | Status: DC
Start: 2023-01-30 — End: 2023-01-31
  Administered 2023-01-30: 1 g via INTRAMUSCULAR

## 2023-01-30 MED ORDER — PREDNISONE 50 MG PO TABS: 50.0000 mg | ORAL_TABLET | Freq: Every day | ORAL | 0 refills | Status: AC

## 2023-01-30 MED ORDER — IPRATROPIUM-ALBUTEROL 0.5-2.5 (3) MG/3ML IN SOLN
3.0000 mL | Freq: Once | RESPIRATORY_TRACT | Status: AC
  Administered 2023-01-30: 3 mL via RESPIRATORY_TRACT

## 2023-01-30 NOTE — ED Provider Notes (Addendum)
[facility]    CSN: [id number] Arrival date & time: 01/30/23  1755      History   Chief Complaint Chief Complaint  Patient presents with   Nasal Congestion   Wheezing   Cough   Shortness of Breath    HPI Stephanie Jones is a 72 y.o. female with a history of COPD, stroke, migraines, hypertension and breast cancer.  Patient presents today for approximately 3 week history of low-grade fever, fatigue, headaches, shortness of breath and productive cough.  Patient's daughter reports her oxygen has been decreased to 93% at home although does increase when she has a nebulized breathing treatment.  Patient reports his symptoms are getting worse and not better.  She reports being extremely fatigued. Daughter says she can barely hold her eyes open.  Taking Mucinex.  Patient denies any known COVID exposure.  No other complaints.  HPI  Past Medical History:  Diagnosis Date   Cancer [facility])    breast   COPD (chronic obstructive pulmonary disease) (HCC)    GERD (gastroesophageal reflux disease)    Hypertension    Migraines    Stroke (cerebrum) [facility])     Patient Active Problem List   Diagnosis Date Noted   COPD without exacerbation (HCC) 10/02/2021   Smoker 10/02/2021   Altered mental status 09/17/2018    Past Surgical History:  Procedure Laterality Date   ABDOMINAL HYSTERECTOMY     BRAIN SURGERY      OB History   No obstetric history on file.      Home Medications    Prior to Admission medications   Medication Sig Start Date End Date Taking? Authorizing Provider  doxycycline (VIBRAMYCIN) 100 MG capsule Take 1 capsule (100 mg total) by mouth 2 (two) times daily for 7 days. 01/30/23 02/06/23 Yes Shirlee Latch, PA-C  predniSONE (DELTASONE) 50 MG tablet Take 1 tablet (50 mg total) by mouth daily for 5 days. 01/30/23 02/04/23 Yes Shirlee Latch, PA-C  promethazine-dextromethorphan (PROMETHAZINE-DM) 6.25-15 MG/5ML syrup Take 5 mLs by mouth 4 (four) times daily as needed.  01/30/23  Yes Shirlee Latch, PA-C  albuterol (PROVENTIL HFA) 108 (90 Base) MCG/ACT inhaler Inhale 2 puffs into the lungs every 4 (four) hours as needed for wheezing or shortness of breath.    [provider]  amitriptyline (ELAVIL) 25 MG tablet TAKE 1 TABLET BY MOUTH EVERY DAY AT NIGHT 04/27/20   [provider]  atorvastatin (LIPITOR) 80 MG tablet Take 80 mg by mouth daily. 03/30/22   [provider]  baclofen (LIORESAL) 10 MG tablet Take 20 mg by mouth 3 (three) times daily.     [provider]  busPIRone (BUSPAR) 5 MG tablet Take 15 mg by mouth 3 (three) times daily.    [provider]  chlorthalidone (HYGROTON) 25 MG tablet Take 25 mg by mouth daily.    [provider]  clobetasol cream (TEMOVATE) 0.05 % Apply 1 application topically 2 (two) times daily as needed. Apply to hands and feet twice daily as needed    [provider]  clonazePAM (KLONOPIN) 1 MG tablet Take 1 mg by mouth 3 (three) times daily.    [provider]  clonazePAM [name]) 1 MG tablet Take by mouth. 05/18/22 06/17/22  [provider]  diltiazem (CARDIZEM CD) 180 MG 24 hr capsule Take 180 mg by mouth daily. 04/28/22   [provider]  fluticasone (FLONASE) 50 MCG/ACT nasal spray Place 1 spray into both nostrils daily.  [provider]  fluticasone (FLONASE) 50 MCG/ACT nasal spray 1 spray into each nostril daily. 08/23/21   [provider]  fluticasone furoate-vilanterol (BREO ELLIPTA) 100-25 MCG/INH AEPB Inhale 1 puff into the lungs daily.    [provider]  furosemide (LASIX) 20 MG tablet Take 20 mg by mouth daily. 03/29/22   [provider]  gabapentin (NEURONTIN) 100 MG capsule Take 100 mg by mouth 3 (three) times daily as needed.    [provider]  ipratropium (ATROVENT HFA) 17 MCG/ACT inhaler Inhale 2 puffs into the lungs every 4 (four) hours as needed for wheezing.    [provider]   ipratropium-albuterol (DUONEB) 0.5-2.5 (3) MG/3ML SOLN Take by nebulization. 12/21/21   [provider]  lamoTRIgine (LAMICTAL) 100 MG tablet Take 300 mg by mouth every evening.     [provider]  lisinopril (PRINIVIL,ZESTRIL) 20 MG tablet Take 20 mg by mouth daily.    [provider]  loratadine (CLARITIN) 10 MG tablet Take 10 mg by mouth daily.    [provider]  metoprolol tartrate (LOPRESSOR) 25 MG tablet Take by mouth. 04/08/20 10/02/21  [provider]  naloxone [facility]) nasal spray 4 mg/0.1 mL One spray in either nostril once for known/suspected opioid overdose. May repeat every 2-3 minutes in alternating nostril til EMS arrives 04/21/21   [provider]  NURTEC 75 MG TBDP Take 1 tablet by mouth every morning. 05/17/22   [provider]  nystatin (MYCOSTATIN) 100000 UNIT/ML suspension Take 5 mLs (500,000 Units total) by mouth 4 (four) times daily. Swish in the mouth and retain for as long as possible before swallowing. 07/29/20   Tommie Sams, DO  omeprazole (PRILOSEC) 20 MG capsule Take 20 mg by mouth daily.    [provider]  pravastatin (PRAVACHOL) 40 MG tablet Take 40 mg by mouth daily.     [provider]  promethazine (PHENERGAN) 25 MG tablet Take 25 mg by mouth every 6 (six) hours as needed for nausea or vomiting.    [provider]  ranitidine (ZANTAC) 150 MG tablet Take 150 mg by mouth 2 (two) times daily.    [provider]  sertraline (ZOLOFT) 50 MG tablet Take 150 mg by mouth daily. 03/14/22   [provider]  Spacer/Aero-Holding Chambers (AEROCHAMBER MV) inhaler Use as instructed 12/21/22   Becky Augusta, NP  SUMAtriptan [facility]) 20 MG/ACT nasal spray Place 20 mg into the nose every 2 (two) hours as needed for migraine or headache. May repeat in 2 hours if headache persists or recurs.    [provider]  SUMAtriptan (IMITREX) 50 MG tablet SMARTSIG:0.5 Tablet(s) By  Mouth Once PRN 04/14/22   [provider]  tamsulosin (FLOMAX) 0.4 MG CAPS capsule Take 0.4 mg by mouth at bedtime. 05/15/22   [provider]  tiotropium (SPIRIVA) 18 MCG inhalation capsule Place 18 mcg into inhaler and inhale daily.    [provider]  traZODone (DESYREL) 50 MG tablet Take 50 mg by mouth at bedtime.    [provider]  tretinoin (RETIN-A) 0.025 % cream Apply 1 Application topically at bedtime. 04/11/22   [provider]  tretinoin (RETIN-A) 0.025 % gel Apply topically at bedtime.    [provider]  varenicline (CHANTIX) 1 MG tablet Take 1 mg by mouth 2 (two) times daily.    [provider]  Monte Fantasia INHUB 500-50 MCG/ACT AEPB 1 puff 2 (two) times daily. 05/11/22   [provider]  Family History No family history on file.  Social History Social History   Tobacco Use   Smoking status: Every Day   Smokeless tobacco: Never  Vaping Use   Vaping Use: Never used  Substance Use Topics   Alcohol use: No   Drug use: No     Allergies   Varenicline   Review of Systems Review of Systems  Constitutional:  Positive for fatigue. Negative for chills, diaphoresis and fever.  HENT:  Positive for congestion, rhinorrhea and sinus pressure. Negative for ear pain and sore throat.   Respiratory:  Positive for cough, shortness of breath and wheezing.   Cardiovascular:  Negative for chest pain.  Gastrointestinal:  Negative for abdominal pain, nausea and vomiting.  Musculoskeletal:  Negative for arthralgias and myalgias.  Skin:  Negative for rash.  Neurological:  Positive for headaches. Negative for weakness.  Hematological:  Negative for adenopathy.     Physical Exam Triage Vital Signs ED Triage Vitals  Enc Vitals Group     BP 06/01/22 1853 (!) 112/54     Pulse Rate 06/01/22 1853 68     Resp --      Temp 06/01/22 1853 99 F (37.2 C)     Temp Source 06/01/22 1853 Oral     SpO2 06/01/22 1853 94 %      Weight 06/01/22 1859 151 lb 3.2 oz (68.6 kg)     Height 06/01/22 1852 5\' 4"  (1.626 m)     Head Circumference --      Peak Flow --      Pain Score 06/01/22 1851 7     Pain Loc --      Pain Edu? --      Excl. in GC? --    No data found.  Updated Vital Signs BP 113/66 (BP Location: Left Arm)   Pulse (!) 58   Temp 98.6 F (37 C) (Oral)   Resp 18   SpO2 93%      Physical Exam Vitals and nursing note reviewed.  Constitutional:      General: She is not in acute distress.    Appearance: Normal appearance. She is ill-appearing. She is not toxic-appearing.  HENT:     Head: Normocephalic and atraumatic.     Right Ear: Tympanic membrane, ear canal and external ear normal.     Left Ear: Tympanic membrane, ear canal and external ear normal.     Nose: Congestion present.     Mouth/Throat:     Mouth: Mucous membranes are moist.     Pharynx: Oropharynx is clear.  Eyes:     General: No scleral icterus.       Right eye: No discharge.        Left eye: No discharge.     Conjunctiva/sclera: Conjunctivae normal.  Cardiovascular:     Rate and Rhythm: Normal rate and regular rhythm.     Heart sounds: Normal heart sounds.  Pulmonary:     Effort: Pulmonary effort is normal. No respiratory distress.     Breath sounds: Wheezing and rhonchi present.     Comments: Diffuse wheezing and rhonchi throughout all lung fields Musculoskeletal:     Cervical back: Neck supple.  Skin:    General: Skin is dry.  Neurological:     General: No focal deficit present.     Mental Status: She is alert. Mental status is at baseline.     Motor: No weakness.     Gait: Gait normal.  Psychiatric:  Mood and Affect: Mood normal.        Behavior: Behavior normal.        Thought Content: Thought content normal.      UC Treatments / Results  Labs (all labs ordered are listed, but only abnormal results are displayed) Labs Reviewed - No data to display   EKG   Radiology DG Chest 2 View  Result  Date: 01/30/2023 CLINICAL DATA:  Productive cough. EXAM: CHEST - 2 VIEW COMPARISON:  Chest radiograph dated 12/21/2022. FINDINGS: Streaky density in the lingula, likely atelectasis/scarring. Developing infiltrate is not excluded clinical correlation is recommended. The right lung is clear. No pleural effusion or pneumothorax. Stable cardiac silhouette. No acute osseous pathology. Degenerative changes of the spine. IMPRESSION: Lingular atelectasis/scarring. Developing infiltrate is not excluded. Electronically Signed   By: Elgie Collard M.D.   On: 01/30/2023 19:21    Procedures Procedures (including critical care time)  Medications Ordered in UC Medications  ipratropium-albuterol (DUONEB) 0.5-2.5 (3) MG/3ML nebulizer solution 3 mL (3 mLs Nebulization Given 01/30/23 1916)    Initial Impression / Assessment and Plan / UC Course  I have reviewed the triage vital signs and the nursing notes.  Pertinent labs & imaging results that were available during my care of the patient were reviewed by me and considered in my medical decision making (see chart for details).   72 year old female with history of COPD presents for approximately 3 week history of fatigue, productive cough and increased shortness of breath from baseline.  Has been taking Mucinex at home and using her inhalers and breathing treatments.  Symptoms not improving at home. Worsening symptoms x 1 week.  Vitals are stable.  She is ill-appearing but nontoxic.  On exam she has nasal congestion as well as diffuse wheezing and rhonchi throughout all lung fields.  No respiratory distress.  Chest x-ray ordered.  Patient given duoneb treatment. O2 improved to 93% from 91%. O2 then went down to 89%. She says she is feeling a little better though. Wants to avoid ED at this time.  X-ray without obvious pneumonia.  Patient given 1 g ceftriaxone and 10 mg IM dexamethasone in clinic.  Advised patient symptoms are consistent with COPD exacerbation.   We will treat with doxycycline and prednisone at this time.  She was given Levaquin by last provider about a month ago for COPD exacerbation. We will have her continue breathing treatments at home and start promethazine DM as well as increase rest and fluids.  Advised following up with her PCP in the next few days for recheck.  Advised return or go to emergency department for any acute worsening symptoms including if she develops a fever, worsening cough or increased shortness of breath. Advised her to have very low threshold to go to ED.  Patient has acute illness with systemic symptoms and exacerbation of chronic condition.    Final Clinical Impressions(s) / UC Diagnoses   Final diagnoses:  COPD exacerbation (HCC)  Acute cough  Dyspnea, unspecified type     Discharge Instructions      -Symptoms are due to a COPD exacerbation.  I have sent antibiotics and prednisone to the pharmacy.  Continue breathing treatments and inhalers at home.  Also sent cough medicine. Plenty rest and fluids. - Symptoms should be improving over the next few days but if you believe the oxygen is dropping and you becoming more short of breath, develop a fever, have worsening cough or pain in your chest, please go to emergency department  for further work-up.         ED Prescriptions     Medication Sig Dispense Auth. Provider   predniSONE (DELTASONE) 50 MG tablet Take 1 tablet (50 mg total) by mouth daily for 5 days. 5 tablet Shirlee Latch, PA-C   promethazine-dextromethorphan (PROMETHAZINE-DM) 6.25-15 MG/5ML syrup Take 5 mLs by mouth 4 (four) times daily as needed. 118 mL Eusebio Friendly B, PA-C   doxycycline (VIBRAMYCIN) 100 MG capsule Take 1 capsule (100 mg total) by mouth 2 (two) times daily for 7 days. 14 capsule Shirlee Latch, PA-C      I have reviewed the PDMP during this encounter.     Shirlee Latch, PA-C 01/30/23 1930    Shirlee Latch, PA-C 01/30/23 1936

## 2023-01-30 NOTE — Discharge Instructions (Addendum)
-  Symptoms are due to a COPD exacerbation.  I have sent antibiotics and prednisone to the pharmacy.  Continue breathing treatments and inhalers at home.  Also sent cough medicine. Plenty rest and fluids. - Symptoms should be improving over the next few days but if you believe the oxygen is dropping and you becoming more short of breath, develop a fever, have worsening cough or pain in your chest, please go to emergency department for further work-up.

## 2023-01-30 NOTE — ED Triage Notes (Addendum)
Pt presents with a productive cough, nasal congestion, wheezing, her ribs feels like they are on fire and SOB x 2 week. Pt has been taking Mucinex for her symptoms.

## 2023-06-30 ENCOUNTER — Encounter: Payer: Self-pay | Admitting: Emergency Medicine

## 2023-06-30 ENCOUNTER — Ambulatory Visit
Admission: EM | Admit: 2023-06-30 | Discharge: 2023-06-30 | Disposition: A | Discharge status: Home / Self Care | Payer: Medicare HMO | Attending: Family Medicine | Admitting: Family Medicine

## 2023-06-30 DIAGNOSIS — J441 Chronic obstructive pulmonary disease with (acute) exacerbation: Secondary | ICD-10-CM

## 2023-06-30 DIAGNOSIS — R0602 Shortness of breath: Secondary | ICD-10-CM | POA: Diagnosis not present

## 2023-06-30 MED ORDER — AEROCHAMBER MV MISC: 2 refills | Status: AC

## 2023-06-30 MED ORDER — IPRATROPIUM-ALBUTEROL 0.5-2.5 (3) MG/3ML IN SOLN
3.0000 mL | Freq: Once | RESPIRATORY_TRACT | Status: AC
  Administered 2023-06-30: 3 mL via RESPIRATORY_TRACT

## 2023-06-30 MED ORDER — LEVOFLOXACIN 750 MG PO TABS: 750.0000 mg | ORAL_TABLET | Freq: Every day | ORAL | 0 refills | Status: AC

## 2023-06-30 MED ORDER — ALBUTEROL SULFATE HFA 108 (90 BASE) MCG/ACT IN AERS: 2.0000 | INHALATION_SPRAY | RESPIRATORY_TRACT | 12 refills | Status: AC | PRN

## 2023-06-30 MED ORDER — TIOTROPIUM BROMIDE MONOHYDRATE 18 MCG IN CAPS: 18.0000 ug | ORAL_CAPSULE | Freq: Every day | RESPIRATORY_TRACT | 6 refills | Status: AC

## 2023-06-30 MED ORDER — FLUCONAZOLE 150 MG PO TABS
150.0000 mg | ORAL_TABLET | ORAL | 0 refills | Status: AC
Start: 2023-06-30 — End: 2023-07-04

## 2023-06-30 MED ORDER — PREDNISONE 10 MG PO TABS: ORAL_TABLET | ORAL | 0 refills | Status: DC

## 2023-06-30 MED ORDER — IPRATROPIUM-ALBUTEROL 0.5-2.5 (3) MG/3ML IN SOLN: 3.0000 mL | RESPIRATORY_TRACT | 0 refills | Status: AC | PRN

## 2023-06-30 MED ORDER — DEXAMETHASONE SODIUM PHOSPHATE 10 MG/ML IJ SOLN
10.0000 mg | Freq: Once | INTRAMUSCULAR | Status: AC
  Administered 2023-06-30: 10 mg via INTRAMUSCULAR

## 2023-06-30 NOTE — ED Notes (Signed)
Checked O2 in waiting room per arrival complaint. O2 93%-94% on room air. Pulse 84. Pt not in any distress.

## 2023-06-30 NOTE — ED Provider Notes (Signed)
[facility]    CSN: [id number] Arrival date & time: 06/30/23  1231      History   Chief Complaint Chief Complaint  Patient presents with   Shortness of Breath   COPD    HPI Stephanie Jones is a 72 y.o. female.   HPI  History obtained from {source of history:310783}. Stephanie Jones presents for nasal congestion, shortness of breath, productive cough that is green that started 2 weeks ago. Her daughter had similar sx but she feels better now.  She dropped to 89% last night but it got up to 91% with breathing treatment. She has COPD.    Fever : no  Chills: no Sore throat: no   Cough: no Sputum: no Chest tightness: no Shortness of breath: no Wheezing: no  Nasal congestion : no  Rhinorrhea: no Myalgias: no Appetite: normal  Hydration: normal  Abdominal pain: no Nausea: no Vomiting: no Diarrhea: No Rash: No Sleep disturbance: no Headache: no      Past Medical History:  Diagnosis Date   Cancer (HCC)    breast   COPD (chronic obstructive pulmonary disease) (HCC)    GERD (gastroesophageal reflux disease)    Hypertension    Migraines    Stroke (cerebrum) [facility])     Patient Active Problem List   Diagnosis Date Noted   COPD without exacerbation (HCC) 10/02/2021   Smoker 10/02/2021   Altered mental status 09/17/2018    Past Surgical History:  Procedure Laterality Date   ABDOMINAL HYSTERECTOMY     BRAIN SURGERY      OB History   No obstetric history on file.      Home Medications    Prior to Admission medications   Medication Sig Start Date End Date Taking? Authorizing Provider  albuterol (PROVENTIL HFA) 108 (90 Base) MCG/ACT inhaler Inhale 2 puffs into the lungs every 4 (four) hours as needed for wheezing or shortness of breath.    [provider]  amitriptyline (ELAVIL) 25 MG tablet TAKE 1 TABLET BY MOUTH EVERY DAY AT NIGHT 04/27/20   [provider]  atorvastatin (LIPITOR) 80 MG tablet Take 80 mg by mouth daily. 03/30/22    [provider]  baclofen (LIORESAL) 10 MG tablet Take 20 mg by mouth 3 (three) times daily.     [provider]  busPIRone (BUSPAR) 5 MG tablet Take 15 mg by mouth 3 (three) times daily.    [provider]  chlorthalidone (HYGROTON) 25 MG tablet Take 25 mg by mouth daily.    [provider]  clobetasol cream (TEMOVATE) 0.05 % Apply 1 application topically 2 (two) times daily as needed. Apply to hands and feet twice daily as needed    [provider]  clonazePAM (KLONOPIN) 1 MG tablet Take 1 mg by mouth 3 (three) times daily.    [provider]  clonazePAM Stephanie Jones) 1 MG tablet Take by mouth. 05/18/22 06/17/22  [provider]  diltiazem (CARDIZEM CD) 180 MG 24 hr capsule Take 180 mg by mouth daily. 04/28/22   [provider]  fluticasone (FLONASE) 50 MCG/ACT nasal spray Place 1 spray into both nostrils daily.    [provider]  fluticasone (FLONASE) 50 MCG/ACT nasal spray 1 spray into each nostril daily. 08/23/21   [provider]  fluticasone furoate-vilanterol (BREO ELLIPTA) 100-25 MCG/INH AEPB Inhale 1 puff into the lungs daily.    [provider]  furosemide (LASIX) 20 MG tablet Take 20 mg by mouth daily. 03/29/22  [provider]  gabapentin (NEURONTIN) 100 MG capsule Take 100 mg by mouth 3 (three) times daily as needed.    [provider]  ipratropium (ATROVENT HFA) 17 MCG/ACT inhaler Inhale 2 puffs into the lungs every 4 (four) hours as needed for wheezing.    [provider]  ipratropium-albuterol (DUONEB) 0.5-2.5 (3) MG/3ML SOLN Take by nebulization. 12/21/21   [provider]  lamoTRIgine (LAMICTAL) 100 MG tablet Take 300 mg by mouth every evening.     [provider]  lisinopril (PRINIVIL,ZESTRIL) 20 MG tablet Take 20 mg by mouth daily.    [provider]  loratadine (CLARITIN) 10 MG tablet Take 10 mg by mouth daily.    [provider]  metoprolol tartrate (LOPRESSOR) 25 MG tablet Take by mouth. 04/08/20 10/02/21  [provider]  naloxone [facility]) nasal spray 4 mg/0.1 mL One spray in either nostril once for known/suspected opioid overdose. May repeat every 2-3 minutes in alternating nostril til EMS arrives 04/21/21   [provider]  NURTEC 75 MG TBDP Take 1 tablet by mouth every morning. 05/17/22   [provider]  nystatin (MYCOSTATIN) 100000 UNIT/ML suspension Take 5 mLs (500,000 Units total) by mouth 4 (four) times daily. Swish in the mouth and retain for as long as possible before swallowing. 07/29/20   Tommie Sams, DO  omeprazole (PRILOSEC) 20 MG capsule Take 20 mg by mouth daily.    [provider]  pravastatin (PRAVACHOL) 40 MG tablet Take 40 mg by mouth daily.     [provider]  promethazine (PHENERGAN) 25 MG tablet Take 25 mg by mouth every 6 (six) hours as needed for nausea or vomiting.    [provider]  promethazine-dextromethorphan (PROMETHAZINE-DM) 6.25-15 MG/5ML syrup Take 5 mLs by mouth 4 (four) times daily as needed. 01/30/23   Shirlee Latch, PA-C  ranitidine (ZANTAC) 150 MG tablet Take 150 mg by mouth 2 (two) times daily.    [provider]  sertraline (ZOLOFT) 50 MG tablet Take 150 mg by mouth daily. 03/14/22   [provider]  Spacer/Aero-Holding Chambers (AEROCHAMBER MV) inhaler Use as instructed 12/21/22   Becky Augusta, NP  SUMAtriptan [facility]) 20 MG/ACT nasal spray Place 20 mg into the nose every 2 (two) hours as needed for migraine or headache. May repeat in 2 hours if headache persists or recurs.    [provider]  SUMAtriptan (IMITREX) 50 MG tablet SMARTSIG:0.5 Tablet(s) By Mouth Once PRN 04/14/22   [provider]  tamsulosin (FLOMAX) 0.4 MG CAPS capsule Take 0.4 mg by mouth at bedtime. 05/15/22   [provider]  tiotropium (SPIRIVA) 18 MCG inhalation capsule Place 18 mcg into inhaler and inhale daily.     [provider]  traZODone (DESYREL) 50 MG tablet Take 50 mg by mouth at bedtime.    [provider]  tretinoin (RETIN-A) 0.025 % cream Apply 1 Application topically at bedtime. 04/11/22   [provider]  tretinoin (RETIN-A) 0.025 % gel Apply topically at bedtime.    [provider]  varenicline (CHANTIX) 1 MG tablet Take 1 mg by mouth 2 (two) times daily.    [provider]  Monte Fantasia INHUB 500-50 MCG/ACT AEPB 1 puff 2 (two) times daily. 05/11/22   [provider]    Family History History reviewed. No pertinent family history.  Social History Social History   Tobacco Use   Smoking status: Every Day   Smokeless tobacco: Never  Vaping Use  Vaping status: Never Used  Substance Use Topics   Alcohol use: No   Drug use: No     Allergies   Varenicline   Review of Systems Review of Systems: negative unless otherwise stated in HPI.      Physical Exam Triage Vital Signs ED Triage Vitals  Encounter Vitals Group     BP 06/30/23 1308 109/64     Systolic BP Percentile --      Diastolic BP Percentile --      Pulse Rate 06/30/23 1308 70     Resp 06/30/23 1308 15     Temp 06/30/23 1308 98.6 F (37 C)     Temp Source 06/30/23 1308 Oral     SpO2 06/30/23 1308 92 %     Weight 06/30/23 1307 151 lb 10.8 oz (68.8 kg)     Height 06/30/23 1307 5\' 4"  (1.626 m)     Head Circumference --      Peak Flow --      Pain Score 06/30/23 1306 4     Pain Loc --      Pain Education --      Exclude from Growth Chart --    No data found.  Updated Vital Signs BP 109/64 (BP Location: Left Arm)   Pulse 70   Temp 98.6 F (37 C) (Oral)   Resp 15   Ht 5\' 4"  (1.626 m)   Wt 68.8 kg   SpO2 92%   BMI 26.04 kg/m   Visual Acuity Right Eye Distance:   Left Eye Distance:   Bilateral Distance:    Right Eye Near:   Left Eye Near:    Bilateral Near:     Physical Exam GEN:     alert, non-toxic appearing female in no distress ***   HENT:   mucus membranes moist, oropharyngeal ***without lesions or ***erythema, no*** tonsillar hypertrophy or exudates, *** moderate erythematous edematous turbinates, ***clear nasal discharge, ***bilateral TM normal EYES:   pupils equal and reactive, ***no scleral injection or discharge NECK:  normal ROM, no ***lymphadenopathy, ***no meningismus   RESP:  no increased work of breathing, ***clear to auscultation bilaterally CVS:   regular rate ***and rhythm Skin:   warm and dry, no rash on visible skin***    UC Treatments / Results  Labs (all labs ordered are listed, but only abnormal results are displayed) Labs Reviewed - No data to display  EKG   Radiology No results found.  Procedures Procedures (including critical care time)  Medications Ordered in UC Medications - No data to display  Initial Impression / Assessment and Plan / UC Course  I have reviewed the triage vital signs and the nursing notes.  Pertinent labs & imaging results that were available during my care of the patient were reviewed by me and considered in my medical decision making (see chart for details).       Pt is a 72 y.o. female who presents for *** days of respiratory symptoms. Yocelin is ***afebrile here without recent antipyretics. Satting well on room air. Overall pt is ***non-toxic appearing, well hydrated, without respiratory distress. Pulmonary exam ***is unremarkable.  COVID testing obtained ***and was negative. ***Pt to quarantine until COVID test results or longer if positive.  I will call patient with test results, if positive. History consistent with ***viral respiratory illness. Discussed symptomatic treatment.  Explained lack of efficacy of antibiotics in viral disease.  Typical duration of symptoms discussed.   Return and ED precautions given and voiced  understanding. Discussed MDM, treatment plan and plan for follow-up with patient*** who agrees with plan.     Final Clinical Impressions(s) / UC  Diagnoses   Final diagnoses:  None   Discharge Instructions   None    ED Prescriptions   None    PDMP not reviewed this encounter.

## 2023-06-30 NOTE — ED Triage Notes (Signed)
Patient c/o cough, chest congestion and SOB that started 2 weeks ago.  Patient reports fevers.  Patient has history of COPD.

## 2023-06-30 NOTE — Discharge Instructions (Addendum)
Stop by the pharmacy to pick up your prescriptions.  Follow up with your primary care provider to discuss seeing a lung doctor.    For the next 48 hours, use your DuoNeb treatments every 4-6 hours while awake.  You should be using 2 inhalers daily (Wixela and Spiriva) and the albuterol as needed.  It may be helpful to set alarms to help remember when to use these.

## 2024-04-04 ENCOUNTER — Ambulatory Visit (INDEPENDENT_AMBULATORY_CARE_PROVIDER_SITE_OTHER)

## 2024-04-04 ENCOUNTER — Ambulatory Visit: Admission: EM | Admit: 2024-04-04 | Discharge: 2024-04-04 | Disposition: A | Discharge status: Home / Self Care

## 2024-04-04 DIAGNOSIS — J441 Chronic obstructive pulmonary disease with (acute) exacerbation: Secondary | ICD-10-CM

## 2024-04-04 DIAGNOSIS — J189 Pneumonia, unspecified organism: Secondary | ICD-10-CM

## 2024-04-04 MED ORDER — IPRATROPIUM-ALBUTEROL 0.5-2.5 (3) MG/3ML IN SOLN
3.0000 mL | Freq: Once | RESPIRATORY_TRACT | Status: AC
  Administered 2024-04-04: 3 mL via RESPIRATORY_TRACT

## 2024-04-04 MED ORDER — PREDNISONE 20 MG PO TABS
40.0000 mg | ORAL_TABLET | Freq: Every day | ORAL | 0 refills | Status: AC
Start: 2024-04-04 — End: 2024-04-09

## 2024-04-04 MED ORDER — AMOXICILLIN-POT CLAVULANATE 875-125 MG PO TABS: 1.0000 | ORAL_TABLET | Freq: Two times a day (BID) | ORAL | 0 refills | Status: AC

## 2024-04-04 MED ORDER — AZITHROMYCIN 250 MG PO TABS: ORAL_TABLET | ORAL | 0 refills | Status: AC

## 2024-04-04 NOTE — ED Provider Notes (Signed)
 [facility]  Note:  This document was prepared using Conservation officer, historic buildings and may include unintentional dictation errors.  MRN: [id number] DOB: [birth year]  Subjective:   Stephanie Jones is a 73 y.o. female presenting for acute shortness of breath, cough, nasal congestion, wheezing, dizziness, chest tightness and pressure x 2 days.  Patient reports history of COPD has been using albuterol  with minimal improvement to symptoms.  Patient denies any fever, body aches, sore throat.  Patient denies any known sick contacts.  Patient reports some wheezing usually worse at night.  No current facility-administered medications for this encounter.  Current Outpatient Medications:    amitriptyline (ELAVIL) 25 MG tablet, TAKE 1 TABLET BY MOUTH EVERY DAY AT NIGHT, Disp: , Rfl:    amoxicillin -clavulanate (AUGMENTIN ) 875-125 MG tablet, Take 1 tablet by mouth every 12 (twelve) hours for 5 days., Disp: 10 tablet, Rfl: 0   atorvastatin (LIPITOR) 80 MG tablet, Take 80 mg by mouth daily., Disp: , Rfl:    azithromycin  (ZITHROMAX  Z-PAK) 250 MG tablet, Take 500 mg on day 1 followed by 250 mg for 4 days.  Take medication for a total of 5 days., Disp: 6 tablet, Rfl: 0   baclofen (LIORESAL) 10 MG tablet, Take 20 mg by mouth 3 (three) times daily. , Disp: , Rfl:    busPIRone (BUSPAR) 5 MG tablet, Take 15 mg by mouth 3 (three) times daily., Disp: , Rfl:    chlorthalidone  (HYGROTON ) 25 MG tablet, Take 25 mg by mouth daily., Disp: , Rfl:    clonazePAM (KLONOPIN) 1 MG tablet, Take 1 mg by mouth 3 (three) times daily., Disp: , Rfl:    diltiazem (CARDIZEM CD) 180 MG 24 hr capsule, Take 180 mg by mouth daily., Disp: , Rfl:    gabapentin (NEURONTIN) 100 MG capsule, Take 100 mg by mouth 3 (three) times daily as needed., Disp: , Rfl:    lamoTRIgine (LAMICTAL) 100 MG tablet, Take 300 mg by mouth every evening. , Disp: , Rfl:    levofloxacin  (LEVAQUIN ) 750 MG tablet, Take 1 tablet (750 mg total) by mouth  daily., Disp: 5 tablet, Rfl: 0   omeprazole (PRILOSEC) 20 MG capsule, Take 20 mg by mouth daily., Disp: , Rfl:    pravastatin  (PRAVACHOL ) 40 MG tablet, Take 40 mg by mouth daily. , Disp: , Rfl:    predniSONE  (DELTASONE ) 20 MG tablet, Take 2 tablets (40 mg total) by mouth daily for 5 days., Disp: 10 tablet, Rfl: 0   sertraline (ZOLOFT) 50 MG tablet, Take 150 mg by mouth daily., Disp: , Rfl:    tamsulosin (FLOMAX) 0.4 MG CAPS capsule, Take 0.4 mg by mouth at bedtime., Disp: , Rfl:    traZODone (DESYREL) 50 MG tablet, Take 50 mg by mouth at bedtime., Disp: , Rfl:    WIXELA INHUB 500-50 MCG/ACT AEPB, 1 puff 2 (two) times daily., Disp: , Rfl:    albuterol  (PROVENTIL  HFA) 108 (90 Base) MCG/ACT inhaler, Inhale 2 puffs into the lungs every 4 (four) hours as needed for wheezing or shortness of breath., Disp: 17 each, Rfl: 12   clobetasol cream (TEMOVATE) 0.05 %, Apply 1 application topically 2 (two) times daily as needed. Apply to hands and feet twice daily as needed, Disp: , Rfl:    clonazePAM (KLONOPIN) 1 MG tablet, Take by mouth., Disp: , Rfl:    fluticasone (FLONASE) 50 MCG/ACT nasal spray, Place 1 spray into both nostrils daily., Disp: , Rfl:    fluticasone (FLONASE) 50 MCG/ACT nasal spray,  1 spray into each nostril daily., Disp: , Rfl:    furosemide (LASIX) 20 MG tablet, Take 20 mg by mouth daily., Disp: , Rfl:    ipratropium-albuterol  (DUONEB) 0.5-2.5 (3) MG/3ML SOLN, Take 3 mLs by nebulization every 4 (four) hours as needed., Disp: 240 mL, Rfl: 0   lisinopril  (PRINIVIL ,ZESTRIL ) 20 MG tablet, Take 20 mg by mouth daily., Disp: , Rfl:    loratadine (CLARITIN) 10 MG tablet, Take 10 mg by mouth daily., Disp: , Rfl:    metoprolol tartrate (LOPRESSOR) 25 MG tablet, Take by mouth., Disp: , Rfl:    naloxone (NARCAN) nasal spray 4 mg/0.1 mL, One spray in either nostril once for known/suspected opioid overdose. May repeat every 2-3 minutes in alternating nostril til EMS arrives, Disp: , Rfl:    NURTEC 75 MG  TBDP, Take 1 tablet by mouth every morning., Disp: , Rfl:    nystatin  (MYCOSTATIN ) 100000 UNIT/ML suspension, Take 5 mLs (500,000 Units total) by mouth 4 (four) times daily. Swish in the mouth and retain for as long as possible before swallowing., Disp: 140 mL, Rfl: 0   promethazine  (PHENERGAN ) 25 MG tablet, Take 25 mg by mouth every 6 (six) hours as needed for nausea or vomiting., Disp: , Rfl:    promethazine -dextromethorphan (PROMETHAZINE -DM) 6.25-15 MG/5ML syrup, Take 5 mLs by mouth 4 (four) times daily as needed., Disp: 118 mL, Rfl: 0   ranitidine (ZANTAC) 150 MG tablet, Take 150 mg by mouth 2 (two) times daily., Disp: , Rfl:    Spacer/Aero-Holding Chambers (AEROCHAMBER MV) inhaler, Use as instructed, Disp: 1 each, Rfl: 2   SUMAtriptan (IMITREX) 20 MG/ACT nasal spray, Place 20 mg into the nose every 2 (two) hours as needed for migraine or headache. May repeat in 2 hours if headache persists or recurs., Disp: , Rfl:    SUMAtriptan (IMITREX) 50 MG tablet, SMARTSIG:0.5 Tablet(s) By Mouth Once PRN, Disp: , Rfl:    tiotropium (SPIRIVA ) 18 MCG inhalation capsule, Place 1 capsule (18 mcg total) into inhaler and inhale daily., Disp: 30 capsule, Rfl: 6   tretinoin (RETIN-A) 0.025 % cream, Apply 1 Application topically at bedtime., Disp: , Rfl:    tretinoin (RETIN-A) 0.025 % gel, Apply topically at bedtime., Disp: , Rfl:    varenicline (CHANTIX) 1 MG tablet, Take 1 mg by mouth 2 (two) times daily., Disp: , Rfl:    Allergies  Allergen Reactions   Varenicline     Had a hospital stay w [facility]    Past Medical History:  Diagnosis Date   Cancer (HCC)    breast   COPD (chronic obstructive pulmonary disease) (HCC)    GERD (gastroesophageal reflux disease)    Hypertension    Migraines    Stroke (cerebrum) (HCC)      Past Surgical History:  Procedure Laterality Date   ABDOMINAL HYSTERECTOMY     BRAIN SURGERY      History reviewed. No pertinent family history.  Social History   Tobacco Use    Smoking status: Every Day   Smokeless tobacco: Never  Vaping Use   Vaping status: Never Used  Substance Use Topics   Alcohol use: No   Drug use: No    ROS Refer to HPI for ROS details.  Objective:   Vitals: BP 114/86 (BP Location: Left Arm)   Pulse 100   Temp 99.5 F (37.5 C) (Oral)   Resp 20   SpO2 91%   Physical Exam Vitals and nursing note reviewed.  Constitutional:  General: She is not in acute distress.    Appearance: Normal appearance. She is well-developed. She is not ill-appearing or toxic-appearing.  HENT:     Head: Normocephalic and atraumatic.     Nose: Nose normal. No congestion.     Mouth/Throat:     Mouth: Mucous membranes are moist.  Cardiovascular:     Rate and Rhythm: Normal rate and regular rhythm.     Heart sounds: Normal heart sounds. No murmur heard. Pulmonary:     Effort: Pulmonary effort is normal. No respiratory distress.     Breath sounds: No stridor. Wheezing and rhonchi present. No rales.  Chest:     Chest wall: No tenderness.  Skin:    General: Skin is warm and dry.  Neurological:     General: No focal deficit present.     Mental Status: She is alert and oriented to person, place, and time.  Psychiatric:        Mood and Affect: Mood normal.        Behavior: Behavior normal.     Procedures  No results found for this or any previous visit (from the past 24 hours).  Assessment and Plan :     Discharge Instructions       1. Pneumonia of left lower lobe due to infectious organism - EKG 12-Lead EKG performed in UC shows normal sinus rhythm with a ventricular rate of 98 bpm, borderline EKG, no STEMI. - ipratropium-albuterol  (DUONEB) 0.5-2.5 (3) MG/3ML nebulizer solution 3 mL given in UC for wheezing and shortness of breath - DG Chest 2 View x-ray performed in UC shows left lower lobe infiltrate consistent with left lower lobe pneumonia.  These findings are consistent with physical exam - amoxicillin -clavulanate (AUGMENTIN )  875-125 MG tablet; Take 1 tablet by mouth every 12 (twelve) hours for 5 days.  Dispense: 10 tablet; Refill: 0 - azithromycin  (ZITHROMAX  Z-PAK) 250 MG tablet; Take 500 mg on day 1 followed by 250 mg for 4 days.  Take medication for a total of 5 days.  Dispense: 6 tablet; Refill: 0 - predniSONE  (DELTASONE ) 20 MG tablet; Take 2 tablets (40 mg total) by mouth daily for 5 days.  Dispense: 10 tablet; Refill: 0 -Continue to monitor symptoms for any change in severity if there is any escalation of current symptoms or development of new symptoms follow-up in ER for further evaluation and management.      Quetzaly Ebner B Irais Mottram   Chuckie Mccathern, [place], [place] 04/04/24 1545

## 2024-04-04 NOTE — ED Triage Notes (Signed)
 SOB x 2 days with cough, congestion, wheezing, dizziness, chest tightness and pressure.

## 2024-04-04 NOTE — Discharge Instructions (Addendum)
  1. Pneumonia of left lower lobe due to infectious organism - EKG 12-Lead EKG performed in UC shows normal sinus rhythm with a ventricular rate of 98 bpm, borderline EKG, no STEMI. - ipratropium-albuterol  (DUONEB) 0.5-2.5 (3) MG/3ML nebulizer solution 3 mL given in UC for wheezing and shortness of breath - DG Chest 2 View x-ray performed in UC shows left lower lobe infiltrate consistent with left lower lobe pneumonia.  These findings are consistent with physical exam - amoxicillin -clavulanate (AUGMENTIN ) 875-125 MG tablet; Take 1 tablet by mouth every 12 (twelve) hours for 5 days.  Dispense: 10 tablet; Refill: 0 - azithromycin  (ZITHROMAX  Z-PAK) 250 MG tablet; Take 500 mg on day 1 followed by 250 mg for 4 days.  Take medication for a total of 5 days.  Dispense: 6 tablet; Refill: 0 - predniSONE  (DELTASONE ) 20 MG tablet; Take 2 tablets (40 mg total) by mouth daily for 5 days.  Dispense: 10 tablet; Refill: 0 -Continue to monitor symptoms for any change in severity if there is any escalation of current symptoms or development of new symptoms follow-up in ER for further evaluation and management.

## 2024-06-25 ENCOUNTER — Encounter: Payer: Self-pay | Admitting: Emergency Medicine

## 2024-06-25 ENCOUNTER — Ambulatory Visit: Admission: EM | Admit: 2024-06-25 | Discharge: 2024-06-25 | Disposition: A | Discharge status: Home / Self Care

## 2024-06-25 DIAGNOSIS — R21 Rash and other nonspecific skin eruption: Secondary | ICD-10-CM | POA: Diagnosis not present

## 2024-06-25 DIAGNOSIS — R600 Localized edema: Secondary | ICD-10-CM | POA: Diagnosis not present

## 2024-06-25 NOTE — ED Provider Notes (Signed)
 [facility]    CSN: [id number] Arrival date & time: 06/25/24  1338      History   Chief Complaint Chief Complaint  Patient presents with   Foot Swelling   Rash   Leg Swelling    HPI Stephanie Jones is a 73 y.o. female.   HPI  73 year old female with past medical history significant for breast cancer, COPD, GERD, hypertension, migraines, stroke, and essential tremor presents for evaluation of swelling to both lower extremities and feet that is been going on for the past 2 months as well as a red rash on both legs that has been present for the last week.  The patient denies any itching from the rash and she denies any weeping of her legs.  Also no chest pain or shortness of breath.  Past Medical History:  Diagnosis Date   Cancer [facility])    breast   COPD (chronic obstructive pulmonary disease) (HCC)    GERD (gastroesophageal reflux disease)    Hypertension    Migraines    Stroke (cerebrum) [facility])     Patient Active Problem List   Diagnosis Date Noted   COPD without exacerbation (HCC) 10/02/2021   Smoker 10/02/2021   Altered mental status 09/17/2018    Past Surgical History:  Procedure Laterality Date   ABDOMINAL HYSTERECTOMY     BRAIN SURGERY      OB History   No obstetric history on file.      Home Medications    Prior to Admission medications   Medication Sig Start Date End Date Taking? Authorizing Provider  albuterol  (PROVENTIL  HFA) 108 (90 Base) MCG/ACT inhaler Inhale 2 puffs into the lungs every 4 (four) hours as needed for wheezing or shortness of breath. 06/30/23   Brimage, Vondra, DO  amitriptyline (ELAVIL) 25 MG tablet TAKE 1 TABLET BY MOUTH EVERY DAY AT NIGHT 04/27/20   [provider]  atorvastatin (LIPITOR) 80 MG tablet Take 80 mg by mouth daily. 03/30/22   [provider]  azithromycin  (ZITHROMAX  Z-PAK) 250 MG tablet Take 500 mg on day 1 followed by 250 mg for 4 days.  Take medication for a total of 5 days. 04/04/24    Reddick, Johnathan B, NP  baclofen (LIORESAL) 10 MG tablet Take 20 mg by mouth 3 (three) times daily.     [provider]  busPIRone (BUSPAR) 5 MG tablet Take 15 mg by mouth 3 (three) times daily.    [provider]  chlorthalidone  (HYGROTON ) 25 MG tablet Take 25 mg by mouth daily.    [provider]  clobetasol cream (TEMOVATE) 0.05 % Apply 1 application topically 2 (two) times daily as needed. Apply to hands and feet twice daily as needed    [provider]  clonazePAM (KLONOPIN) 1 MG tablet Take 1 mg by mouth 3 (three) times daily.    [provider]  clonazePAM (KLONOPIN) 1 MG tablet Take by mouth. 05/18/22 06/17/22  [provider]  diltiazem (CARDIZEM CD) 180 MG 24 hr capsule Take 180 mg by mouth daily. 04/28/22   [provider]  fluticasone (FLONASE) 50 MCG/ACT nasal spray Place 1 spray into both nostrils daily.    [provider]  fluticasone (FLONASE) 50 MCG/ACT nasal spray 1 spray into each nostril daily. 08/23/21   [provider]  furosemide (LASIX) 20 MG tablet Take 20 mg by mouth daily. 03/29/22   [provider]  gabapentin (NEURONTIN) 100 MG capsule Take 100 mg by mouth 3 (  three) times daily as needed.    [provider]  ipratropium-albuterol  (DUONEB) 0.5-2.5 (3) MG/3ML SOLN Take 3 mLs by nebulization every 4 (four) hours as needed. 06/30/23   Brimage, Vondra, DO  lamoTRIgine (LAMICTAL) 100 MG tablet Take 300 mg by mouth every evening.     [provider]  levofloxacin  (LEVAQUIN ) 750 MG tablet Take 1 tablet (750 mg total) by mouth daily. 06/30/23   Brimage, Vondra, DO  lisinopril  (PRINIVIL ,ZESTRIL ) 20 MG tablet Take 20 mg by mouth daily.    [provider]  loratadine (CLARITIN) 10 MG tablet Take 10 mg by mouth daily.    [provider]  metoprolol tartrate (LOPRESSOR) 25 MG tablet Take by mouth. 04/08/20 10/02/21  [provider]  naloxone [facility]) nasal  spray 4 mg/0.1 mL One spray in either nostril once for known/suspected opioid overdose. May repeat every 2-3 minutes in alternating nostril til EMS arrives 04/21/21   [provider]  NURTEC 75 MG TBDP Take 1 tablet by mouth every morning. 05/17/22   [provider]  nystatin  (MYCOSTATIN ) 100000 UNIT/ML suspension Take 5 mLs (500,000 Units total) by mouth 4 (four) times daily. Swish in the mouth and retain for as long as possible before swallowing. 07/29/20   Cook, Jayce G, DO  omeprazole (PRILOSEC) 20 MG capsule Take 20 mg by mouth daily.    [provider]  pravastatin  (PRAVACHOL ) 40 MG tablet Take 40 mg by mouth daily.     [provider]  promethazine  (PHENERGAN ) 25 MG tablet Take 25 mg by mouth every 6 (six) hours as needed for nausea or vomiting.    [provider]  promethazine -dextromethorphan (PROMETHAZINE -DM) 6.25-15 MG/5ML syrup Take 5 mLs by mouth 4 (four) times daily as needed. 01/30/23   Arvis Jolan NOVAK, PA-C  ranitidine (ZANTAC) 150 MG tablet Take 150 mg by mouth 2 (two) times daily.    [provider]  sertraline (ZOLOFT) 50 MG tablet Take 150 mg by mouth daily. 03/14/22   [provider]  Spacer/Aero-Holding Chambers (AEROCHAMBER MV) inhaler Use as instructed 06/30/23   Brimage, Vondra, DO  SUMAtriptan (IMITREX) 20 MG/ACT nasal spray Place 20 mg into the nose every 2 (two) hours as needed for migraine or headache. May repeat in 2 hours if headache persists or recurs.    [provider]  SUMAtriptan (IMITREX) 50 MG tablet SMARTSIG:0.5 Tablet(s) By Mouth Once PRN 04/14/22   [provider]  tamsulosin (FLOMAX) 0.4 MG CAPS capsule Take 0.4 mg by mouth at bedtime. 05/15/22   [provider]  tiotropium (SPIRIVA ) 18 MCG inhalation capsule Place 1 capsule (18 mcg total) into inhaler and inhale daily. 06/30/23   Brimage, Vondra, DO  traZODone (DESYREL) 50 MG tablet Take 50 mg by mouth at bedtime.    [provider]  tretinoin (RETIN-A) 0.025 % cream Apply 1 Application topically at bedtime. 04/11/22   [provider]  tretinoin (RETIN-A) 0.025 % gel Apply topically at bedtime.    [provider]  varenicline (CHANTIX) 1 MG tablet Take 1 mg by mouth 2 (two) times daily.    [provider]  NAPOLEON INHUB 500-50 MCG/ACT AEPB 1 puff 2 (two) times daily. 05/11/22   [provider]    Family History No family history on file.  Social History Social History   Tobacco Use   Smoking status: Every Day   Smokeless tobacco: Never  Vaping Use   Vaping status: Never Used  Substance Use Topics  Alcohol use: No   Drug use: No     Allergies   Varenicline   Review of Systems Review of Systems  Constitutional:  Negative for fever.  Respiratory:  Negative for shortness of breath.   Cardiovascular:  Positive for leg swelling. Negative for chest pain and palpitations.  Skin:  Positive for rash.     Physical Exam Triage Vital Signs ED Triage Vitals  Encounter Vitals Group     BP      Girls Systolic BP Percentile      Girls Diastolic BP Percentile      Boys Systolic BP Percentile      Boys Diastolic BP Percentile      Pulse      Resp      Temp      Temp src      SpO2      Weight      Height      Head Circumference      Peak Flow      Pain Score      Pain Loc      Pain Education      Exclude from Growth Chart    No data found.  Updated Vital Signs BP 132/61 (BP Location: Right Arm)   Pulse 75   Temp 99 F (37.2 C) (Oral)   Resp 18   SpO2 95%   Visual Acuity Right Eye Distance:   Left Eye Distance:   Bilateral Distance:    Right Eye Near:   Left Eye Near:    Bilateral Near:     Physical Exam Vitals and nursing note reviewed.  Constitutional:      Appearance: Normal appearance. She is not ill-appearing.  HENT:     Head: Normocephalic and atraumatic.  Cardiovascular:     Rate and Rhythm: Normal rate. Rhythm irregular.      Pulses: Normal pulses.     Heart sounds: Normal heart sounds. No murmur heard.    No friction rub. No gallop.  Pulmonary:     Effort: Pulmonary effort is normal.     Breath sounds: Normal breath sounds. No wheezing, rhonchi or rales.  Musculoskeletal:     Right lower leg: Edema present.     Left lower leg: Edema present.     Comments: 1+ pitting edema bilaterally from knees to ankles  Skin:    General: Skin is warm and dry.     Capillary Refill: Capillary refill takes 2 to 3 seconds.     Findings: Rash present.     Comments: Petechial rash on bilateral lower extremities.  Neurological:     General: No focal deficit present.     Mental Status: She is alert and oriented to person, place, and time.      UC Treatments / Results  Labs (all labs ordered are listed, but only abnormal results are displayed) Labs Reviewed - No data to display  EKG   Radiology No results found.  Procedures Procedures (including critical care time)  Medications Ordered in UC Medications - No data to display  Initial Impression / Assessment and Plan / UC Course  I have reviewed the triage vital signs and the nursing notes.  Pertinent labs & imaging results that were available during my care of the patient were reviewed by me and considered in my medical decision making (see chart for details).   Patient is a very pleasant, nontoxic-appearing 73 year old female brought in by her daughter for evaluation of bilateral lower  extremity swelling.  This has been an issue that has been going on for the past 2 months and the patient had been taking Lasix intermittently for the swelling but was recently advised by her PCP to stop.  She reports that when she puts her legs up at night the swelling does go down but then when she stands up and walks the next day the swelling returns.  She never has any weeping and she does not have any chest pain or shortness of breath.  She does have a history of atrial  fibrillation and chest auscultation does reveal that patient has atrial fibrillation at present.  The patient's daughter is concerned that she may be developing cellulitis because she has an erythematous rash on both of her lower extremities.    As you can see in images above, the patient's lower extremities are swollen and there is 1+ pitting edema bilaterally.  Her cap refill is 2 to 3 seconds and her lower extremities are warm.  DP and PT pulses are 2+ and strong.  The rash is erythematous and petechial looking.  I suspect this is secondary to ruptured capillaries from the recurrent swelling.  Patient has no history of thrombocytopenia and blood work from July of this year shows a normal platelet count of 231.  The patient reports that she has worn knee-high compression hose in the past but feels like it made the swelling worse.  According to the primary care note from 06/17/2024 by Dr. Agustina she has ordered an echocardiogram to evaluate for possible cardiac cause for the lower extremity edema.  At that visit the note indicates that the patient's legs were not swollen at that time.  She also has a pretty significant tremor and has not tolerated medication well.  She has been referred to neurology for a nerve stimulator implant evaluation.  I suspect that the patient's swelling is secondary to peripheral vascular disease and in competent veins in her lower extremities.  I will encourage her to keep her legs elevated is much as possible and try using compression hose again.  I recommend that she use the full-length pantyhose's for better compression to help eliminate the fluid.  If she continues to experience swelling she can revisit the possibility of using intermittent diuretics with her primary care provider.   Final Clinical Impressions(s) / UC Diagnoses   Final diagnoses:  Bilateral lower extremity edema  Rash and nonspecific skin eruption     Discharge Instructions      As we discussed, there  could be several causes for the swelling in your leg to include that your heart is not pumping effectively because of being in atrial fibrillation as well as the blood vessels in your lower extremities being incompetent due to age.  Your PCP has ordered an echocardiogram to evaluate your heart function to see if this is a contributing factor.  I would recommend that you resume wearing compression hose and I would recommend wearing the full-length pantyhose.  You can purchase these from [employer] as well as from the pharmacy.  You will need to know several measurements in order to purchase the correct size.  Each brand will have a size guide in their description.  Use a seamstress measuring tape to get the correct measurements.  The rash on your lower extremities I believe is secondary to your leg swelling causing small ruptures of the surface capillaries resulting in the red rash that you see.  This is not infectious in origin.  ED Prescriptions   None    PDMP not reviewed this encounter.   Bernardino Ditch, NP 06/25/24 [phone]

## 2024-06-25 NOTE — ED Triage Notes (Signed)
 Pt presents with bilateral leg and foot swelling for a few weeks. She was seen by her PCP last week. She has a rash on bilateral legs x 1 week.

## 2024-06-25 NOTE — Discharge Instructions (Addendum)
 As we discussed, there could be several causes for the swelling in your leg to include that your heart is not pumping effectively because of being in atrial fibrillation as well as the blood vessels in your lower extremities being incompetent due to age.  Your PCP has ordered an echocardiogram to evaluate your heart function to see if this is a contributing factor.  I would recommend that you resume wearing compression hose and I would recommend wearing the full-length pantyhose.  You can purchase these from [employer] as well as from the pharmacy.  You will need to know several measurements in order to purchase the correct size.  Each brand will have a size guide in their description.  Use a seamstress measuring tape to get the correct measurements.  The rash on your lower extremities I believe is secondary to your leg swelling causing small ruptures of the surface capillaries resulting in the red rash that you see.  This is not infectious in origin.
